# Patient Record
Sex: Female | Born: 1980 | Race: White | Hispanic: No | Marital: Married | Smoking: Former smoker
Health system: Southern US, Community
[De-identification: ages and names within clinical notes are randomized; demographics above are authoritative.]

## PROBLEM LIST (undated history)

## (undated) DIAGNOSIS — E119 Type 2 diabetes mellitus without complications: Secondary | ICD-10-CM

## (undated) DIAGNOSIS — I10 Essential (primary) hypertension: Secondary | ICD-10-CM

## (undated) DIAGNOSIS — F419 Anxiety disorder, unspecified: Secondary | ICD-10-CM

## (undated) DIAGNOSIS — N920 Excessive and frequent menstruation with regular cycle: Secondary | ICD-10-CM

## (undated) HISTORY — DX: Type 2 diabetes mellitus without complications: E11.9

## (undated) HISTORY — DX: Essential (primary) hypertension: I10

## (undated) HISTORY — DX: Excessive and frequent menstruation with regular cycle: N92.0

## (undated) HISTORY — PX: TUBAL LIGATION: SHX77

---

## 2000-11-27 ENCOUNTER — Other Ambulatory Visit: Admission: RE | Admit: 2000-11-27 | Discharge: 2000-11-27 | Payer: Self-pay | Admitting: Obstetrics and Gynecology

## 2001-05-03 ENCOUNTER — Ambulatory Visit (HOSPITAL_COMMUNITY): Admission: RE | Admit: 2001-05-03 | Discharge: 2001-05-03 | Payer: Self-pay | Admitting: Obstetrics and Gynecology

## 2001-05-13 ENCOUNTER — Ambulatory Visit (HOSPITAL_COMMUNITY): Admission: RE | Admit: 2001-05-13 | Discharge: 2001-05-13 | Payer: Self-pay | Admitting: Obstetrics and Gynecology

## 2001-05-17 ENCOUNTER — Observation Stay (HOSPITAL_COMMUNITY): Admission: AD | Admit: 2001-05-17 | Discharge: 2001-05-18 | Payer: Self-pay | Admitting: Obstetrics and Gynecology

## 2001-05-21 ENCOUNTER — Ambulatory Visit (HOSPITAL_COMMUNITY): Admission: RE | Admit: 2001-05-21 | Discharge: 2001-05-21 | Payer: Self-pay | Admitting: Obstetrics and Gynecology

## 2001-05-24 ENCOUNTER — Ambulatory Visit (HOSPITAL_COMMUNITY): Admission: RE | Admit: 2001-05-24 | Discharge: 2001-05-24 | Payer: Self-pay | Admitting: Obstetrics and Gynecology

## 2001-05-28 ENCOUNTER — Ambulatory Visit (HOSPITAL_COMMUNITY): Admission: AD | Admit: 2001-05-28 | Discharge: 2001-05-28 | Payer: Self-pay | Admitting: Obstetrics and Gynecology

## 2001-06-01 ENCOUNTER — Ambulatory Visit (HOSPITAL_COMMUNITY): Admission: RE | Admit: 2001-06-01 | Discharge: 2001-06-01 | Payer: Self-pay | Admitting: Obstetrics and Gynecology

## 2001-06-02 ENCOUNTER — Inpatient Hospital Stay (HOSPITAL_COMMUNITY): Admission: RE | Admit: 2001-06-02 | Discharge: 2001-06-06 | Payer: Self-pay | Admitting: Obstetrics and Gynecology

## 2005-08-04 ENCOUNTER — Encounter: Payer: Self-pay | Admitting: Obstetrics and Gynecology

## 2005-08-04 ENCOUNTER — Inpatient Hospital Stay (HOSPITAL_COMMUNITY): Admission: RE | Admit: 2005-08-04 | Discharge: 2005-08-07 | Payer: Self-pay | Admitting: Obstetrics and Gynecology

## 2008-03-02 ENCOUNTER — Other Ambulatory Visit: Admission: RE | Admit: 2008-03-02 | Discharge: 2008-03-02 | Payer: Self-pay | Admitting: Obstetrics and Gynecology

## 2010-11-08 NOTE — Op Note (Signed)
NAME:  Eileen Nunez, Eileen Nunez                ACCOUNT NO.:  [id number]   MEDICAL RECORD NO.:  [id number]          PATIENT TYPE:  INP   LOCATION:  A401                          FACILITY:  [facility]   PHYSICIAN:  [name], M.D. DATE OF BIRTH:  23-Nov-1980   DATE OF PROCEDURE:  08/04/2005  DATE OF DISCHARGE:                                 OPERATIVE REPORT   PREOPERATIVE DIAGNOSES:  1.  Pregnancy, 39 weeks.  2.  Repeat Cesarean section, not for trial for labor.  3.  Desire for elective permanent sterilization.   POSTOPERATIVE DIAGNOSES:  1.  Pregnancy, 39 weeks.  2.  Repeat Cesarean section, not for trial for labor.  3.  Desire for elective permanent sterilization.   PROCEDURE:  Repeat low transverse cervical Cesarean section, partial  salpingectomy, wide excision of cicatrix.   SURGEON:  [name], M.D.   ASSISTANT:  Forde Dandy, R.N.   ANESTHESIA:  Spinal.   COMPLICATIONS:  None.   FINDINGS:  Healthy, 8-pound 4.1-ounce, female infant. Lower abdominal laxity  allowing easy excision of cicatrix.   DETAILS OF PROCEDURE:  The patient was taken to the operating room, prepped  and draped in usual fashion for low abdominal surgery after spinal  anesthesia was introduced with Foley catheter in place. Pfannenstiel-type  incision was performed approximately 3 cm below the old scar, making a wider  incision than before, total length approximately 25 to 30 cm. The incision  was incised sharply down to the fascia which was opened transversely,  dissected from the underlying rectus muscles and the peritoneal cavity  entered in the midline. Bladder flap was easily developed on the very floppy  lower uterine segment. Transverse uterine incision was made in the lower  uterine segment where it was quite thin, fetal vertex attempted to be  delivered. Because of the flexibility of the uterine contour, it required  use of the vacuum extractor to ease the deliver process, and an 8-pound 4.1-  ounce infant, Apgars 8 and 9 was delivered without difficulty. See Dr.  Samul Dada notes for further details.   Cord blood samples were obtained. Placenta delivered intact, [facility]  presentation. The uterus was closed in a single layer of running locking  closure of 0 chromic with good hemostasis.   The bladder flap was then reapproximated.   Tubal ligation. Tubal sterilization was performed by incarcerating a mid  segment knuckle of each fallopian tube with 2-0 chromic followed by excision  of the incarcerated knuckle of tube and confirmation of hemostasis.   Abdomen was irrigated with antibiotic-containing solution as the uterus had  been. The anterior peritoneum was closed with 2-0 chromic. There was some  omentum adhesions to the anterior peritoneal surface from the prior  incision, and the majority of those were cut free to improve omental  mobility.   Once peritoneum was closed with running 2-0 chromic, the fascia was trimmed  and the overlying fatty tissue, excising an 8-cm wide x 20 to 25-cm long  ellipse of skin and fatty tissue. The fascia was shortened by a distance of  approximately 2 cm at  its mid point and tapering to the sides. The fascia  was pulled together in the midline with good results. Subcutaneous tissues  required 2-0 chromic interrupted reapproximation, and then staple closure of  the skin completed the procedure. A subcutaneous JP drain was left in place  in the subcu fatty space. Sponge and needle counts were correct. The patient  tolerated the procedure well and went to recovery room in good condition.      [name], M.D.  Electronically Signed     JVF/MEDQ  D:  08/04/2005  T:  08/05/2005  Job:  [id number]

## 2010-11-08 NOTE — Op Note (Signed)
[facility]  Patient:    Eileen Nunez, Eileen Nunez Visit Number: [id number] MRN: [id number]          Service Type: OBS Location: 4A A427 01 Attending Physician:  [name] Dictated by:   Christin Bach, M.D. Proc. Date: 06/03/01 Admit Date:  06/02/2001                             Operative Report  PREOPERATIVE DIAGNOSES: 1. Pregnancy at 38 weeks. 2. Pregnancy-induced hypertension. 3. Failed medical induction of labor. 4. Cephalopelvic disproportion.  POSTOPERATIVE DIAGNOSES: 1. Pregnancy at 38 weeks. 2. Pregnancy-induced hypertension. 3. Failed medical induction of labor. 4. Cephalopelvic disproportion. 5. Secondary to persistent occiput posterior presentation with arrested    dilation at pelvic inlet.  OPERATION:  Primary low transverse cervical cesarean section  SURGEON:  Christin Bach, M.D.  ASSISTANTKarna Dupes C.S.T.  ANESTHESIA:  Spinal, ______  COMPLICATIONS:  None.  FINDINGS:  Health female infant.  Apgars 8/9.  Weight ______ and occiput posterior presentation at pelvic inlet with no entry into the birth canal occurring.  DETAILS OF PROCEDURE:  The patient was taken to the operating room and prepped and draped for lower abdominal surgery after spinal anesthesia was introduced. Pfannenstiel type incision performed approximately 20 cm in length through the skin and subcutaneous tissue with transverse opening of the fascia and midline opening of the rectus muscles and easy entry into the peritoneal cavity.   The bladder flap was developed on the well developed lower uterine segment  which was then nicked transversely and extended laterally using index finger traction.  The vertex was rotated from its right occiput posterior presentation into the incision.  Vacuum extractor applied to guide the vertex, and the infant delivered through the incision with good results.  The baby cried vigorously.  There was nuchal cord x 1.  Release of the  shoulders was performed, and then the baby delivered easily.  Amniotic fluid was clear ______ .  Cord was clamped and infant passed to awaiting physician, Dr. Lubertha South.  Cord blood samples were obtained.  Placenta delivered [facility] presentation. The membrane remnants removed using sponges, and the uterus irrigated with antibiotic solution.  Pennington clamps were used to grasp the uterine edges, particularly from two large varicose veins in the lower uterine segment transverse incision edge which were bleeding heavily.  Single layer running locking closure of the uterine incision resulted in good tissue closure and hemostasis, and 2-0 chromic closure of the bladder flap completed the bladder flap closure.  The peritoneal cavity was irrigated briefly.  Laparotomy equipment removed, 2-0 chromic used on peritoneal cavity, rectus muscles pulled in the midline, two interrupted sutures of 2-0 chromic and then 0 Vicryl closure of the fascia performed.  Subcutaneous fatty tissues were irrigated, confirmed as hemostatic, closed using interrupted 2-0 plain x 3 stitch and staple closure of skin completed the procedure.  Estimated blood loss was 2 cc. Dictated by:   Christin Bach, M.D. Attending Physician:  [name] DD:  06/03/01 TD:  06/03/01 Job: [id number]

## 2010-11-08 NOTE — H&P (Signed)
NAME:  Eileen Nunez, ALMANZAR                ACCOUNT NO.:  [id number]   MEDICAL RECORD NO.:  [id number]          PATIENT TYPE:  AMB   LOCATION:  DAY                           FACILITY:  [facility]   PHYSICIAN:  [name], M.D. DATE OF BIRTH:  1980-08-08   DATE OF ADMISSION:  08/04/2005  DATE OF DISCHARGE:  LH                                HISTORY & PHYSICAL   HISTORY OF PRESENT ILLNESS:  This 30 year old female gravida 2, para 1, AB0,  LMP Nov 08, 2004 placing her menstrual [facility] August 15, 2005 with  corresponding first and second trimester ultrasounds.  She is admitted for  repeat cesarean section.  Nicki has been followed through our office.  Has  decided to proceed with permanent sterilization at the time of cesarean  section.  Options of delayed sterilization at six months has been  specifically offered to patient.  She declines option of same.  Failure rate  of 1-2% for cesarean tubal ligations is discussed with patient.   PAST MEDICAL HISTORY:  1.  Gestational diabetes, diet controlled.  2.  Repeat cesarean section for wide excision of cicatrix.   PAST SURGICAL HISTORY:  Negative.   ALLERGIES:  None.   PHYSICAL EXAMINATION:  VITAL SIGNS:  Height 5 feet 8 inches, weight 249,  blood pressure 124/74, pulse 90s.  GENERAL:  Healthy-appearing Caucasian female, alert and oriented x3.  HEENT:  Pupils are equal, round, and reactive to light.  CARDIOVASCULAR:  Unremarkable.  ABDOMEN:  39 cm fundal height.  Estimated fetal weight 8 pounds.  PELVIC:  Cervix deferred at this time.   PRENATAL LABORATORIES:  O positive.  Urine drug screen negative.  Rubella  immunity present.  Hemoglobin 14, hematocrit 44.  Hepatitis, HIV, RPR, GC  and Chlamydia all negative.  Glucose tolerance test abnormal with 3-hour  glucose tolerance test failed.   PLAN:  Repeat cesarean section, tubal ligation with wide excision of  cicatrix on August 04, 2005.      [name], M.D.  Electronically  Signed     JVF/MEDQ  D:  07/25/2005  T:  07/25/2005  Job:  [id number]   cc:   Francoise Schaumann. Raynelle Highland  Fax: [phone]   [facility]  Fax: [phone]   [facility]

## 2010-11-08 NOTE — Discharge Summary (Signed)
NAMECESILY, CUOCO                ACCOUNT NO.:  [id number]   MEDICAL RECORD NO.:  [id number]          PATIENT TYPE:  INP   LOCATION:  A401                          FACILITY:  [facility]   PHYSICIAN:  [name], M.D. DATE OF BIRTH:  March 05, 1981   DATE OF ADMISSION:  08/04/2005  DATE OF DISCHARGE:  02/14/2007LH                                 DISCHARGE SUMMARY   ADMISSION DIAGNOSES:  1.  Pregnancy at 38-1/2 weeks for repeat cesarean section.  2.  Desire for elective sterilization.   DISCHARGE DIAGNOSES:  1.  Pregnancy at 38-1/2 weeks, delivered.  2.  Repeat cesarean section.  3.  Elective sterilization.  4.  Wide excision of cicatrix.   HOSPITAL SUMMARY:  This 30 year old female, gravida 2, para 1, AB 0 was  admitted for repeat cesarean section, tubal ligation and wide excision of  cicatrix as described in HPI.   HOSPITAL COURSE:  The patient was admitted with hemoglobin 10, hematocrit  30, blood type O positive.  She underwent repeat cesarean section, tubal  ligation with wide excision of cicatrix with 600 cc of blood loss.  There  were no complications of the surgery.  JP drain was left in place.  Postoperative hematocrit was 25%.  The patient breast fed and bottles  supplementation, at the time during hospitalization.  JP drain was removed  on postop day #2.   DISCHARGE MEDICATIONS:  1.  Tylox one q.6 h p.r.n. pain.  2.  Ibuprofen 200 mg two q.4 h p.r.n. cramps.  3.  Chromatin Forte iron supplementation b.i.d. for 30 days.   FOLLOWUP:  Follow up in five days for staple removal.      [name], M.D.  Electronically Signed     JVF/MEDQ  D:  08/06/2005  T:  08/06/2005  Job:  [id number]

## 2010-11-08 NOTE — H&P (Signed)
NAME:  Eileen Nunez, Eileen Nunez                ACCOUNT NO.:  [id number]   MEDICAL RECORD NO.:  [id number]          PATIENT TYPE:  AMB   LOCATION:  DAY                           FACILITY:  [facility]   PHYSICIAN:  [name], M.D. DATE OF BIRTH:  [birth year]   DATE OF ADMISSION:  08/04/2005  DATE OF DISCHARGE:  LH                                HISTORY & PHYSICAL   ADMISSION DIAGNOSES:  1.  Pregnancy at 38-1/2 weeks' gestation.  2.  Repeat cesarean section.  3.  Desire for elective permanent sterilization.   HISTORY OF PRESENT ILLNESS:  This 30 year old female, G2, P101, AB 0, LMP on  Nov 08, 2004, placing menstrual [facility] August 15, 2005, with corresponding  first- and second-trimester ultrasounds is admitted after pregnancy follow  up in our office and notable for gestational diabetes, diet-controlled with  excellent blood sugars.  The patient has been followed through our office  with 15 prenatal visits with appropriate weight gain and fundal height  growth.  She has decided and wants to proceed with permanent sterilization  with tubal ligation discussed with failure rate of 1% to 2% discussed.  The  patient understands this and desires to proceed at this time even though  interval options of delayed sterilization are emphasized at length including  risks such as crib death, etc.   PAST SURGICAL HISTORY:  Benign.   ALLERGIES:  No known drug allergies.   SOCIAL HISTORY:  No cigarettes at present.  Previously a smoker.  Alcohol  and recreational drugs were denied.   PHYSICAL EXAMINATION:  VITAL SIGNS:  Height 5 feet 8 inches.  Estimated  weight 249 which is a 36 pound weight gain.  Blood pressure 124/74.  GENERAL:  A healthy, cheerful, overweight, Caucasian female.  HEENT:  Pupils equal round and reactive.  Extraocular movements intact.  CARDIAC:  Unremarkable.  ABDOMEN:  A 39 cm fundal height.  Lax lower abdominal skin secondary to  pregnancy.  Well-healed Pfannenstiel incision.   PRENATAL  LABORATORY DATA:  Blood type O positive.  Urine drug screen  negative.  Rubella immune.  Hemoglobin 14, hematocrit 44.  Hepatitis, HIV,  GC, chlamydia, RPR, HSV-1 and HSV-2 all negative.  Glucose  tolerance test abnormal.  Subsequent blood sugars generally excellent except  for occasional blood sugars high in the 170s when the patient eats  abnormally.   PLAN:  Repeat cesarean section and tubal ligation with wide-excision of  cicatrix on August 04, 2005.      [name], M.D.  Electronically Signed     JVF/MEDQ  D:  07/25/2005  T:  07/25/2005  Job:  [id number]

## 2010-11-08 NOTE — Discharge Summary (Signed)
[facility]  Patient:    Eileen Nunez, Eileen Nunez Visit Number: [id number] MRN: [id number]          Service Type: OBS Location: 4A A427 01 Attending Physician:  [name] Dictated by:   Christin Bach, M.D. Admit Date:  06/02/2001   CC:         Lubertha South, M.D.   Discharge Summary  ADMITTING DIAGNOSES:  Pregnancy 37 weeks 5 days, pregnancy induced hypertension, indicated induction of labor.  DISCHARGE DIAGNOSES:  Pregnancy at 37 weeks 5 days, delivered, pregnancy induced hypertension, stable, failed medical induction secondary to cephalopelvic disproportion.  PROCEDURE:  Cytotec cervical ripening x 6 doses, Pitocin induction of labor, unsuccessful, primary low transverse cervical cesarean section.  Cesarean section was performed on June 03, 2001.  DISCHARGE MEDICATIONS: 1. Labetalol 100 mg p.o. b.i.d. x 30 days. 2. Prenatal iron and vitamins daily. 3. Tylox one q.4h. p.r.n. pain x 20 tablets.  FOLLOWUP:  Four weeks in our office for postpartum visit with the patient breast-feeding and birth control undecided.  HISTORY OF PRESENT ILLNESS:  This 30 year old female gravida 1, para 0 is admitted for induction of labor after progressive increase in blood pressure and edema.  The prenatal course had been complicated by marked weight gain beginning the pregnancy at 189 progressing to 259, 70 pound total weight gain. The patient has had mild hypertension treated with labetalol 200 mg b.i.d. Deep tendon reflexes remained acceptable at 2+.  There has been 3+ pitting edema to the knees with equal edema in the face and upper extremities.  There has been no right upper quadrant or liver function test abnormality.  However, the progression in her symptoms have warranted proceeding with induction. Cervix is soft, anterior, and favorable, the presenting part quite high.  HOSPITAL COURSE:  Patient was admitted.  Received Cytotec cervical  ripening throughout the day of admission June 02, 2001 and throughout the night of December 11.  On June 03, 2001 Pitocin induction of labor and amniotomy was begun at 8 a.m.  The patient never progressed past 2 cm even though the cervix was quite soft and was definitely not the restricting factor. Presenting part remained quite high out of the pelvis and when we did the cesarean section on the afternoon of June 03, 2001 the vertex was found to be in right occiput posterior presentation at the pelvic inlet.  It was considered to be an inlet dystocia.  Infant was a healthy female infant.  Apgars 8/9.  Weight 8 pounds 11.1 ounces (3945 g) with Apgars 9/9 assigned by Dr. Lubertha South, pediatrician in attendance.  Estimated blood loss at delivery was 800 cc.  Patients postpartum course was straightforward.  The patients admitting hemoglobin had been 10.4, hematocrit 29.7 with postpartum hemoglobin 9.1 on postoperative day #1 and hemoglobin 8.1 with hematocrit 22.2 on postoperative day #3.  The patient is mobilizing generous amounts of fluid.  CONDITION ON DISCHARGE:  June 06, 2001:  Stable on postoperative day #3. The patient will be converted to hotel status as the baby has mild neonatal jaundice and will need to stay an additional day due to bilirubin 17.  FOLLOWUP:  Four weeks at our office.  Diuretic was not administered as the patient is breast-feeding. Dictated by:   Christin Bach, M.D. Attending Physician:  [name] DD:  06/06/01 TD:  06/06/01 Job: [id number]

## 2010-11-08 NOTE — Op Note (Signed)
NAMEJORDY, Eileen Nunez                ACCOUNT NO.:  [id number]   MEDICAL RECORD NO.:  [id number]          PATIENT TYPE:  INP   LOCATION:  A401                          FACILITY:  [facility]   PHYSICIAN:  [name], M.D. DATE OF BIRTH:  [birth year]   DATE OF PROCEDURE:  08/04/2005  DATE OF DISCHARGE:  08/07/2005                                 OPERATIVE REPORT   PREOPERATIVE DIAGNOSIS:  Pregnancy, 38-1/[redacted] weeks gestation. Repeat Cesarean  section. Not for trial of labor. Elective permanent sterilization.   POSTOPERATIVE DIAGNOSIS:  Pregnancy, 38-1/[redacted] weeks gestation. Repeat Cesarean  section. Not for trial of labor. Elective permanent sterilization.   PROCEDURE:  Repeat Cesarean section. Bilateral tubal ligation. Wide excision  of old scar.   SURGEON:  Dr. Emelda Fear.   ASSISTANT:  None.   ANESTHESIA:  Spinal.   COMPLICATIONS:  None.   FINDINGS:  Healthy female infant, Apgars 9 and 9.   DETAILS OF PROCEDURE:  The patient was taken to the operating room. Spinal  anesthesia introduced. Abdomen prepped and draped and lower abdominal  incision repeated. Due to the patient's request for excision of the old  cicatrix due to lower abdominal laxity, we excised a wide area of the  cicatrix. This was approximately 10 cm in width and 30 cm in transverse  length. This went all the way down to the fascia. Fascia was then opened in  a standard Pfannenstiel technique transversely. Rectus muscles split in the  midline and the peritoneal cavity opened without difficulty. The lower  abdomen was entered without difficulty. Bladder flap developed on the thin  lower uterine segment and transverse uterine incision made using a knife for  incision and the index fingers for transverse traction to open it  sufficiently to guide the vertex through the incision. Cord was clamped.  Infant passed to waiting pediatrician, Dr. Milinda Cave. See his notes for  details. Cord blood gases were obtained, and placenta  delivered [facility]  presentation. Placenta bed and uterus irrigated with antibiotic solution. A  single layer of running locking closure of the uterine incision followed.  Bladder flap was then reapproximated with 2-0 chromic. The tubal ligation  was negative.   Tubal ligation. Tubal ligation was performed by grasping the mid segment  knuckle of each tube, elevating it and doubly ligating with 0 chromic around  the incarcerated knuckle of tube. The incarcerated specimen was excised. The  opposite side was treated similarly.   Anterior peritoneum was inspected. Abdomen confirmed as hemostatic,  irrigated, anterior peritoneum closed with 2-0 chromic. The fascia trimmed  of approximately 1 inch in the midline to allow for more satisfactory  cosmetic skin edge approximation, then the fascia pulled together with  running 0 Vicryl, then the subcutaneous tissues reapproximated with  interrupted 2-0 plain. The subcu JP drain was placed in the subcu fatty  space. Antibiotic irrigation was performed again. Staple closure of the skin  followed, and the patient went to the recovery room in good condition with  clear fluid exuding from the JP drain. The patient went to the recovery room  in good condition.  Sponge and needle counts correct. EBL 600 cc.      [name], M.D.  Electronically Signed     JVF/MEDQ  D:  08/11/2005  T:  08/12/2005  Job:  [id number]   cc:   Francoise Schaumann. Milford Cage DO, FAAP  Fax: [phone]

## 2010-11-08 NOTE — H&P (Signed)
[facility]  Patient:    Eileen Nunez, Eileen Nunez Visit Number: [id number] MRN: [id number]          Service Type: OBS Location: 4A A427 01 Attending Physician:  [name] Dictated by:   Zerita Boers, N.M. Admit Date:  06/02/2001   CC:         [facility]   History and Physical  DATE OF BIRTH:  1981/02/23  ADMISSION DIAGNOSIS:  Pregnancy at 37 weeks, 5 days, with pregnancy-induced hypertension, pitting edema.  Being admitted for induction of labor of pregnancy-induced hypertension.  PAST MEDICAL HISTORY:  Negative.  PAST SURGICAL HISTORY:  Negative.  ALLERGIES:  No known drug allergies.  MEDICATIONS:  Prenatal vitamins and labetalol 200 b.i.d.  SOCIAL HISTORY:  She is married.  Her husband is supportive.  FAMILY HISTORY:  Positive for Crohns.  PRENATAL COURSE:  Prenatal course has been complicated by pregnancy-induced hypertension starting at 34 weeks.  The patient at that time was placed on labetalol 100 b.i.d.  That managed the patient for several weeks, and then she had another increase.  Her labetalol has been increased to 200 b.i.d., and now we are not controlling her hypertension with 200 labetalol b.i.d.  Blood pressure for the past two days has been 140/90.  DTRs are 2+.  There is 3+ pitting edema.  The patient has had a 70-pound weight gain during the pregnancy.  DTRs are 2+ bilaterally, no clonus.  PLAN:  We are going to admit for Cytotec induction of labor for pregnancy-induced hypertension. Dictated by:   Zerita Boers, N.M. Attending Physician:  [name] DD:  06/02/01 TD:  06/02/01 Job: [id number]

## 2011-06-30 ENCOUNTER — Other Ambulatory Visit (HOSPITAL_COMMUNITY)
Admission: RE | Admit: 2011-06-30 | Discharge: 2011-06-30 | Disposition: A | Discharge status: Home / Self Care | Payer: BC Managed Care – PPO | Source: Ambulatory Visit | Attending: Obstetrics and Gynecology | Admitting: Obstetrics and Gynecology

## 2011-06-30 DIAGNOSIS — Z01419 Encounter for gynecological examination (general) (routine) without abnormal findings: Secondary | ICD-10-CM | POA: Insufficient documentation

## 2012-07-29 ENCOUNTER — Other Ambulatory Visit: Payer: Self-pay | Admitting: Adult Health

## 2012-07-29 ENCOUNTER — Other Ambulatory Visit (HOSPITAL_COMMUNITY)
Admission: RE | Admit: 2012-07-29 | Discharge: 2012-07-29 | Disposition: A | Discharge status: Home / Self Care | Payer: BC Managed Care – PPO | Source: Ambulatory Visit | Attending: Obstetrics and Gynecology | Admitting: Obstetrics and Gynecology

## 2012-07-29 DIAGNOSIS — Z1151 Encounter for screening for human papillomavirus (HPV): Secondary | ICD-10-CM | POA: Insufficient documentation

## 2012-07-29 DIAGNOSIS — Z01419 Encounter for gynecological examination (general) (routine) without abnormal findings: Secondary | ICD-10-CM | POA: Insufficient documentation

## 2012-10-08 ENCOUNTER — Encounter: Payer: Self-pay | Admitting: *Deleted

## 2012-10-12 ENCOUNTER — Encounter: Payer: Self-pay | Admitting: Nurse Practitioner

## 2012-10-12 ENCOUNTER — Ambulatory Visit (INDEPENDENT_AMBULATORY_CARE_PROVIDER_SITE_OTHER): Payer: BC Managed Care – PPO | Admitting: Nurse Practitioner

## 2012-10-12 VITALS — BP 118/94 | Wt 207.0 lb

## 2012-10-12 DIAGNOSIS — R5381 Other malaise: Secondary | ICD-10-CM

## 2012-10-12 DIAGNOSIS — Z13220 Encounter for screening for lipoid disorders: Secondary | ICD-10-CM

## 2012-10-12 DIAGNOSIS — I10 Essential (primary) hypertension: Secondary | ICD-10-CM

## 2012-10-12 DIAGNOSIS — F418 Other specified anxiety disorders: Secondary | ICD-10-CM

## 2012-10-12 DIAGNOSIS — R5383 Other fatigue: Secondary | ICD-10-CM

## 2012-10-12 DIAGNOSIS — F341 Dysthymic disorder: Secondary | ICD-10-CM

## 2012-10-12 MED ORDER — PHENTERMINE-TOPIRAMATE ER 3.75-23 MG PO CP24: 3.7500 mg | ORAL_CAPSULE | Freq: Every day | ORAL | Status: DC

## 2012-10-12 MED ORDER — BUPROPION HCL ER (XL) 150 MG PO TB24: ORAL_TABLET | ORAL | Status: DC

## 2012-10-12 MED ORDER — PHENTERMINE-TOPIRAMATE ER 7.5-46 MG PO CP24: 7.5000 mg | ORAL_CAPSULE | Freq: Every day | ORAL | Status: DC

## 2012-10-13 ENCOUNTER — Encounter: Payer: Self-pay | Admitting: Nurse Practitioner

## 2012-10-13 DIAGNOSIS — I10 Essential (primary) hypertension: Secondary | ICD-10-CM | POA: Insufficient documentation

## 2012-10-13 DIAGNOSIS — F418 Other specified anxiety disorders: Secondary | ICD-10-CM | POA: Insufficient documentation

## 2012-10-13 NOTE — Assessment & Plan Note (Signed)
Increase Wellbutrin XL to 300 mg

## 2012-10-13 NOTE — Assessment & Plan Note (Signed)
Start Qsymia as directed.  Continue activity.  Healthy diet.  Recheck in 6 weeks.

## 2012-10-13 NOTE — Progress Notes (Signed)
Subjective:  Presents for followup. Continues to have increased stress in her life, was told recently that she is currently laid off from her job. Overall doing well with her anxiety and stress level, does continue to have some decreased energy. Interested in increasing her Wellbutrin dosage. Also has slowly put on some of her weight. Would like to try a different weight loss medicine. Has taken phentermine fine in the past. No chest pain or shortness of breath. No edema. Compliant with her blood pressure medication. Gets regular preventive health physicals. Has not had her routine lab work.  Objective:   BP 118/94  Wt 207 lb (93.895 kg)  LMP 10/09/2012 NAD. Alert, oriented. Lungs clear. Heart regular rate rhythm. Lower extremities no edema.  Assessment:Hypertension - Plan: Basic metabolic panel, Basic metabolic panel  Fatigue - Plan: Basic metabolic panel, Hepatic function panel, TSH, Vitamin D 25 hydroxy, Basic metabolic panel, Hepatic function panel, TSH, Vitamin D 25 hydroxy  Lipid screening - Plan: Lipid panel, Lipid panel  Morbid obesity  Depression with anxiety  Plan: Meds ordered this encounter  Medications  . buPROPion (WELLBUTRIN XL) 150 MG 24 hr tablet    Sig: Take one each day x 4 days then two each day    Dispense:  56 tablet    Refill:  0    Order Specific Question:  Supervising Provider    Answer:  Merlyn Albert [[id number]]  . Phentermine-Topiramate (QSYMIA) 7.5-46 MG CP24    Sig: Take 7.5-46 mg by mouth daily.    Dispense:  30 capsule    Refill:  0    Order Specific Question:  Supervising Provider    Answer:  Merlyn Albert [[id number]]  . Phentermine-Topiramate (QSYMIA) 3.75-23 MG CP24    Sig: Take 3.75-23 mg by mouth daily.    Dispense:  14 capsule    Refill:  0    Order Specific Question:  Supervising Provider    Answer:  Merlyn Albert [[id number]]   Wellbutrin XL 150 mg 1 by mouth daily x4 days then increase to 2 by mouth daily. If any problems, go back to once a  day dosing. Encourage regular activity and healthy diet. Patient has had a tubal ligation. Discussed risk of birth defects with use of Topamax t. Recheck in 6 weeks, call back sooner if any problems.

## 2012-10-15 LAB — LIPID PANEL
Cholesterol: 134 mg/dL (ref 0–200)
Total CHOL/HDL Ratio: 3.1 Ratio
Triglycerides: 111 mg/dL (ref <150)
VLDL: 22 mg/dL (ref 0–40)

## 2012-10-15 LAB — HEPATIC FUNCTION PANEL
ALT: 12 U/L (ref 0–35)
Bilirubin, Direct: 0.2 mg/dL (ref 0.0–0.3)
Total Bilirubin: 0.9 mg/dL (ref 0.3–1.2)

## 2012-10-15 LAB — BASIC METABOLIC PANEL
BUN: 14 mg/dL (ref 6–23)
Creat: 1.13 mg/dL — ABNORMAL HIGH (ref 0.50–1.10)

## 2012-10-19 ENCOUNTER — Encounter: Payer: Self-pay | Admitting: Nurse Practitioner

## 2012-11-13 ENCOUNTER — Other Ambulatory Visit: Payer: Self-pay | Admitting: Nurse Practitioner

## 2012-11-16 ENCOUNTER — Other Ambulatory Visit: Payer: Self-pay | Admitting: *Deleted

## 2012-11-16 MED ORDER — BUPROPION HCL ER (XL) 150 MG PO TB24: ORAL_TABLET | ORAL | Status: DC

## 2012-11-23 ENCOUNTER — Ambulatory Visit: Payer: BC Managed Care – PPO | Admitting: Nurse Practitioner

## 2012-11-24 ENCOUNTER — Encounter: Payer: Self-pay | Admitting: Nurse Practitioner

## 2012-11-24 ENCOUNTER — Ambulatory Visit (INDEPENDENT_AMBULATORY_CARE_PROVIDER_SITE_OTHER): Payer: BC Managed Care – PPO | Admitting: Nurse Practitioner

## 2012-11-24 VITALS — BP 110/80 | HR 80 | Ht 70.0 in | Wt 202.4 lb

## 2012-11-24 DIAGNOSIS — I10 Essential (primary) hypertension: Secondary | ICD-10-CM

## 2012-11-24 DIAGNOSIS — F341 Dysthymic disorder: Secondary | ICD-10-CM

## 2012-11-24 DIAGNOSIS — F418 Other specified anxiety disorders: Secondary | ICD-10-CM

## 2012-11-24 MED ORDER — PHENTERMINE-TOPIRAMATE ER 11.25-69 MG PO CP24: 11.2500 mg | ORAL_CAPSULE | Freq: Every day | ORAL | Status: DC

## 2012-11-24 NOTE — Assessment & Plan Note (Signed)
Patient ran out of Wellbutrin 2 weeks ago. Is doing fine. Has made some lifestyle changes including increased activity. DC Wellbutrin.

## 2012-11-24 NOTE — Progress Notes (Signed)
Subjective:  Presents for recheck. Is now exercising 4-5 days per week, has started a walking program with her friend. Went off her Wellbutrin about 2 weeks ago. Has felt fine since then. Feels much better now that she can get outside for exercise. Doing well on Qsymia. Denies any adverse affects. Continues to have several family issues although she is dealing with this much better.  Objective:   BP 110/80  Pulse 80  Ht 5\' 10"  (1.778 m)  Wt 202 lb 6 oz (91.797 kg)  BMI 29.04 kg/m2 NAD. Alert, oriented. Lungs clear. Heart regular rate rhythm. BP 110/80 per nurse.  Assessment:Hypertension  Morbid obesity  Plan: Meds ordered this encounter  Medications  . Phentermine-Topiramate (QSYMIA) 11.25-69 MG CP24    Sig: Take 11.25-69 mg by mouth daily.    Dispense:  30 capsule    Refill:  2    Order Specific Question:  Supervising Provider    Answer:  Merlyn Albert [[id number]]   stop Wellbutrin. Patient will run out of insurance near the end of July. To take Qsymia until then. Continue to work on [employer] and continue exercise. Recheck as needed.

## 2012-11-24 NOTE — Assessment & Plan Note (Signed)
Increase Qsymia to 11.25/69 daily.

## 2012-11-24 NOTE — Assessment & Plan Note (Signed)
Stable. BP 110/80.

## 2013-01-20 ENCOUNTER — Other Ambulatory Visit: Payer: Self-pay | Admitting: *Deleted

## 2013-06-27 ENCOUNTER — Other Ambulatory Visit: Payer: Self-pay | Admitting: Nurse Practitioner

## 2013-06-29 ENCOUNTER — Other Ambulatory Visit: Payer: Self-pay | Admitting: Nurse Practitioner

## 2013-07-11 ENCOUNTER — Ambulatory Visit (INDEPENDENT_AMBULATORY_CARE_PROVIDER_SITE_OTHER): Payer: BC Managed Care – PPO | Admitting: Nurse Practitioner

## 2013-07-11 ENCOUNTER — Encounter: Payer: Self-pay | Admitting: Nurse Practitioner

## 2013-07-11 VITALS — BP 122/76 | Ht 70.0 in | Wt 201.0 lb

## 2013-07-11 DIAGNOSIS — F418 Other specified anxiety disorders: Secondary | ICD-10-CM

## 2013-07-11 DIAGNOSIS — F341 Dysthymic disorder: Secondary | ICD-10-CM

## 2013-07-11 MED ORDER — BUPROPION HCL ER (XL) 300 MG PO TB24: 300.0000 mg | ORAL_TABLET | Freq: Every day | ORAL | Status: DC

## 2013-07-12 ENCOUNTER — Encounter: Payer: Self-pay | Admitting: Nurse Practitioner

## 2013-07-12 NOTE — Assessment & Plan Note (Signed)
Stop Qsymia. Restart Wellbutrin as directed. Recheck in 6 months, sooner if needed.

## 2013-07-12 NOTE — Progress Notes (Signed)
Subjective:  Presents for follow up. Has been under more stress for the past few months. Lost her father and nephew in June. Has noticed more depression, fatigue and lack of interest lately. Would like to stop Qsymia and restart Wellbutrin which she has taken fine in the past. Good family support. Has restarted working but does not care for her current job. Doing well with diet. Plans to restart exercise program.  Objective:   BP 122/76  Ht 5\' 10"  (1.778 m)  Wt 201 lb (91.173 kg)  BMI 28.84 kg/m2  NAD. Alert, oriented. Lungs clear. Heart RRR.  Assessment: Depression with anxiety  Plan:  Meds ordered this encounter  Medications  . buPROPion (WELLBUTRIN XL) 300 MG 24 hr tablet    Sig: Take 1 tablet (300 mg total) by mouth daily.    Dispense:  30 tablet    Refill:  5    Order Specific Question:  Supervising Provider    Answer:  Merlyn AlbertLUKING, WILLIAM S [[id number]]   Stop Qsymia. Recheck in 6 months, sooner if any problems.

## 2013-07-12 NOTE — Assessment & Plan Note (Signed)
Stop Qsymia. Encouraged patient to restart exercise program.

## 2013-08-01 ENCOUNTER — Other Ambulatory Visit: Payer: BC Managed Care – PPO | Admitting: Adult Health

## 2013-08-09 ENCOUNTER — Other Ambulatory Visit: Payer: BC Managed Care – PPO | Admitting: Adult Health

## 2013-08-22 ENCOUNTER — Other Ambulatory Visit: Payer: BC Managed Care – PPO | Admitting: Adult Health

## 2013-09-07 ENCOUNTER — Ambulatory Visit (INDEPENDENT_AMBULATORY_CARE_PROVIDER_SITE_OTHER): Payer: BC Managed Care – PPO | Admitting: Adult Health

## 2013-09-07 ENCOUNTER — Encounter (INDEPENDENT_AMBULATORY_CARE_PROVIDER_SITE_OTHER): Payer: Self-pay

## 2013-09-07 ENCOUNTER — Encounter: Payer: Self-pay | Admitting: Adult Health

## 2013-09-07 VITALS — BP 140/88 | HR 74 | Ht 69.0 in | Wt 201.0 lb

## 2013-09-07 DIAGNOSIS — N920 Excessive and frequent menstruation with regular cycle: Secondary | ICD-10-CM

## 2013-09-07 DIAGNOSIS — Z01419 Encounter for gynecological examination (general) (routine) without abnormal findings: Secondary | ICD-10-CM

## 2013-09-07 HISTORY — DX: Excessive and frequent menstruation with regular cycle: N92.0

## 2013-09-07 NOTE — Progress Notes (Signed)
Patient ID: Eileen Nunez, female   DOB: [birth year], 33 y.o.   MRN: [id number] History of Present Illness: Eileen Nunez is a 33 year old white female, married in for a physical, she had a normal pap with negative HPV 07/29/12.She is complaining of periods coming about every 3 weeks and has cramps and it is heavier now.She has had a tubal ligation and thinks she wants the ablation now.   Current Medications, Allergies, Past Medical History, Past Surgical History, Family History and Social History were reviewed in [facility] electronic medical record.     Review of Systems: Patient denies any headaches, blurred vision, shortness of breath, chest pain, abdominal pain, problems with bowel movements, urination, or intercourse. No joint pain, moods good on meds,see positives in HPI.Not ready to quit smoking.Aware of BMI.    Physical Exam:BP 140/88  Pulse 74  Ht 5\' 9"  (1.753 m)  Wt 201 lb (91.173 kg)  BMI 29.67 kg/m2  LMP 08/21/2013 General:  Well developed, well nourished, no acute distress Skin:  Warm and dry Neck:  Midline trachea, normal thyroid Lungs; Clear to auscultation bilaterally Breast:  No dominant palpable mass, retraction, or nipple discharge Cardiovascular: Regular rate and rhythm Abdomen:  Soft, non tender, no hepatosplenomegaly Pelvic:  External genitalia is normal in appearance.  The vagina is normal in appearance. The cervix is bulbous.  Uterus is felt to be normal size, shape, and contour.  No  adnexal masses or tenderness noted. Extremities:  No swelling or varicosities noted Psych:  No mood changes, alert and cooperative, seems happy Discussed labs from 2014.   Impression: Yearly gyn exam no pap Menorrhagia     Plan: Return in 1 week to discuss ablation with Dr Emelda FearFerguson  Review handout on ablation Physical in 1 year

## 2013-09-07 NOTE — Patient Instructions (Signed)
Endometrial Ablation Endometrial ablation removes the lining of the uterus (endometrium). It is usually a same-day, outpatient treatment. Ablation helps avoid major surgery, such as surgery to remove the cervix and uterus (hysterectomy). After endometrial ablation, you will have little or no menstrual bleeding and may not be able to have children. However, if you are premenopausal, you will need to use a reliable method of birth control following the procedure because of the small chance that pregnancy can occur. There are different reasons to have this procedure, which include:  Heavy periods.  Bleeding that is causing anemia.  Irregular bleeding.  Bleeding fibroids on the lining inside the uterus if they are smaller than 3 centimeters. This procedure should not be done if:  You want children in the future.  You have severe cramps with your menstrual period.  You have precancerous or cancerous cells in your uterus.  You were recently pregnant.  You have gone through menopause.  You have had major surgery on the uterus, such as a cesarean delivery. LET [facility] PROVIDER KNOW ABOUT:  Any allergies you have.  All medicines you are taking, including vitamins, herbs, eye drops, creams, and over-the-counter medicines.  Previous problems you or members of your family have had with the use of anesthetics.  Any blood disorders you have.  Previous surgeries you have had.  Medical conditions you have. RISKS AND COMPLICATIONS  Generally, this is a safe procedure. However, as with any procedure, complications can occur. Possible complications include:  Perforation of the uterus.  Bleeding.  Infection of the uterus, bladder, or vagina.  Injury to surrounding organs.  An air bubble to the lung (air embolus).  Pregnancy following the procedure.  Failure of the procedure to help the problem, requiring hysterectomy.  Decreased ability to diagnose cancer in the lining of  the uterus. BEFORE THE PROCEDURE  The lining of the uterus must be tested to make sure there is no pre-cancerous or cancer cells present.  An ultrasound may be performed to look at the size of the uterus and to check for abnormalities.  Medicines may be given to thin the lining of the uterus. PROCEDURE  During the procedure, your health care provider will use a tool called a resectoscope to help see inside your uterus. There are different ways to remove the lining of your uterus.   Radiofrequency  This method uses a radiofrequency-alternating electric current to remove the lining of the uterus.  Cryotherapy This method uses extreme cold to freeze the lining of the uterus.  Heated-Free Liquid  This method uses heated salt (saline) solution to remove the lining of the uterus.  Microwave This method uses high-energy microwaves to heat up the lining of the uterus to remove it.  Thermal balloon  This method involves inserting a catheter with a balloon tip into the uterus. The balloon tip is filled with heated fluid to remove the lining of the uterus. AFTER THE PROCEDURE  After your procedure, do not have sexual intercourse or insert anything into your vagina until permitted by your health care provider. After the procedure, you may experience:  Cramps.  Vaginal discharge.  Frequent urination. Document Released: 04/18/2004 Document Revised: 02/09/2013 Document Reviewed: 11/10/2012 [facility] Patient Information 2014 [place] [place]. Return in 1 week to talk with JVF about ablation Physical in 1 year

## 2013-09-15 ENCOUNTER — Encounter (HOSPITAL_COMMUNITY): Payer: Self-pay | Admitting: Pharmacy Technician

## 2013-09-15 ENCOUNTER — Ambulatory Visit (INDEPENDENT_AMBULATORY_CARE_PROVIDER_SITE_OTHER): Payer: BC Managed Care – PPO | Admitting: Obstetrics and Gynecology

## 2013-09-15 ENCOUNTER — Other Ambulatory Visit: Payer: Self-pay | Admitting: Obstetrics and Gynecology

## 2013-09-15 ENCOUNTER — Encounter: Payer: Self-pay | Admitting: Obstetrics and Gynecology

## 2013-09-15 VITALS — BP 110/64 | Ht 69.0 in | Wt 202.2 lb

## 2013-09-15 DIAGNOSIS — N920 Excessive and frequent menstruation with regular cycle: Secondary | ICD-10-CM

## 2013-09-15 NOTE — H&P (Signed)
  [facility] Visit   Patient name: Eileen Nunez MRN [id number] Date of birth: 08/13/1980  Consult for endometrial ablation  CC & HPI:   Eileen Nunez is a 32 y.o. female presenting today for heavy and more frequent periods. She states her cycle has become less than 28 days. She has 3 heavy days and 3 light days. She reports needing a tampon and a pad overnight. She has a history of BTL. LMP was 08/21/13.  ROS:   Negative except noted above.  Pertinent History Reviewed:   Medical & Surgical Hx: Reviewed: Significant for  Past Medical History   Diagnosis  Date   .  Hypertension      during pregnancy   .  Diabetes mellitus without complication      gestational diabetes   .  Menorrhagia  09/07/2013     Has period about every 3 weeks and has cramp too    Past Surgical History   Procedure  Laterality  Date   .  Cesarean section       x 2   .  Tubal ligation     Medications: Reviewed & Updated - see associated section  Social History: Reviewed - reports that she has been smoking Cigarettes. She has been smoking about 6.00 packs per day. She has never used smokeless tobacco.  Objective Findings:   Vitals: BP 110/64  Ht 5' 9" (1.753 m)  Wt 202 lb 3.2 oz (91.717 kg)  BMI 29.85 kg/m2  LMP 08/21/2013  Physical Examination: General appearance - alert, well appearing, and in no distress and oriented to person, place, and time  Pelvic - VULVA: normal appearing vulva with no masses, tenderness or lesions,  VAGINA: normal appearing vagina with normal color and discharge, no lesions,  CERVIX: normal appearing cervix without discharge or lesions,  UTERUS: uterus is normal size, shape, consistency and nontender, well supported, anteflexed small uterus, 7-8 cm length. Adnexa normal. Confirmed by u/s by jvf.([place])  ADNEXA: normal adnexa in size, nontender and no masses  Assessment & Plan:   A:  1. Menorrhagia, heavy period.  P:  1. Hysteroscopy, D&C, endometrial ablation to be done next  week.     

## 2013-09-15 NOTE — Patient Instructions (Signed)

## 2013-09-15 NOTE — Progress Notes (Signed)
Scheduled Hyst D&C w/ Endometrial Ablation on 09-22-13 @7 :30

## 2013-09-15 NOTE — Progress Notes (Signed)
This chart was scribed by Leone PayorSonum Patel, Medical Scribe, for Dr. Christin BachJohn Keria Widrig on 09/15/13 at 12:24 PM. This chart was reviewed by Dr. Christin BachJohn Estephan Gallardo and is accurate.   [facility] Visit  Patient name: Eileen Nunez MRN [id number]  Date of birth: 09-15-1980 Consult for endometrial ablation CC & HPI:  Eileen Nunez is a 33 y.o. female presenting today for heavy and more frequent periods. She states her cycle has become less than 28 days. She has 3 heavy days and 3 light days. She reports needing a tampon and a pad overnight. She has a history of BTL. LMP was 08/21/13.   ROS:  Negative except noted above.   Pertinent History Reviewed:  Medical & Surgical Hx:  Reviewed: Significant for   Past Medical History  Diagnosis Date  . Hypertension     during pregnancy  . Diabetes mellitus without complication     gestational diabetes  . Menorrhagia 09/07/2013    Has period about every 3 weeks and has cramp too    Past Surgical History  Procedure Laterality Date  . Cesarean section      x 2  . Tubal ligation      Medications: Reviewed & Updated - see associated section Social History: Reviewed -  reports that she has been smoking Cigarettes.  She has been smoking about 6.00 packs per day. She has never used smokeless tobacco.  Objective Findings:  Vitals: BP 110/64  Ht 5\' 9"  (1.753 m)  Wt 202 lb 3.2 oz (91.717 kg)  BMI 29.85 kg/m2  LMP 08/21/2013  Physical Examination: General appearance - alert, well appearing, and in no distress and oriented to person, place, and time Pelvic - VULVA: normal appearing vulva with no masses, tenderness or lesions,  VAGINA: normal appearing vagina with normal color and discharge, no lesions,  CERVIX: normal appearing cervix without discharge or lesions,  UTERUS: uterus is normal size, shape, consistency and nontender, well supported, anteflexed small uterus, 7-8 cm length.  Adnexa normal. Confirmed by u/s by jvf.([place]) ADNEXA: normal adnexa in size,  nontender and no masses    Assessment & Plan:   A:  1. Menorrhagia, heavy period.   P:  1. Hysteroscopy, D&C, endometrial ablation to be done next week.

## 2013-09-16 ENCOUNTER — Encounter (HOSPITAL_COMMUNITY)
Admission: RE | Admit: 2013-09-16 | Discharge: 2013-09-16 | Disposition: A | Discharge status: Home / Self Care | Payer: BC Managed Care – PPO | Source: Ambulatory Visit | Attending: Obstetrics and Gynecology | Admitting: Obstetrics and Gynecology

## 2013-09-16 ENCOUNTER — Other Ambulatory Visit: Payer: Self-pay

## 2013-09-16 ENCOUNTER — Encounter (HOSPITAL_COMMUNITY): Payer: Self-pay

## 2013-09-16 DIAGNOSIS — Z01812 Encounter for preprocedural laboratory examination: Secondary | ICD-10-CM | POA: Insufficient documentation

## 2013-09-16 DIAGNOSIS — Z01810 Encounter for preprocedural cardiovascular examination: Secondary | ICD-10-CM | POA: Insufficient documentation

## 2013-09-16 HISTORY — DX: Anxiety disorder, unspecified: F41.9

## 2013-09-16 LAB — BASIC METABOLIC PANEL
BUN: 11 mg/dL (ref 6–23)
CHLORIDE: 103 meq/L (ref 96–112)
CO2: 29 meq/L (ref 19–32)
Calcium: 9.1 mg/dL (ref 8.4–10.5)
Creatinine, Ser: 1.13 mg/dL — ABNORMAL HIGH (ref 0.50–1.10)
GFR calc Af Amer: 73 mL/min — ABNORMAL LOW (ref >90)
GFR calc non Af Amer: 63 mL/min — ABNORMAL LOW (ref >90)
Glucose, Bld: 90 mg/dL (ref 70–99)
Potassium: 4 mEq/L (ref 3.7–5.3)
SODIUM: 143 meq/L (ref 137–147)

## 2013-09-16 LAB — CBC
HEMATOCRIT: 42.3 % (ref 36.0–46.0)
Hemoglobin: 14.3 g/dL (ref 12.0–15.0)
MCH: 30 pg (ref 26.0–34.0)
MCHC: 33.8 g/dL (ref 30.0–36.0)
MCV: 88.9 fL (ref 78.0–100.0)
PLATELETS: 213 10*3/uL (ref 150–400)
RBC: 4.76 MIL/uL (ref 3.87–5.11)
RDW: 16.2 % — ABNORMAL HIGH (ref 11.5–15.5)
WBC: 9.2 10*3/uL (ref 4.0–10.5)

## 2013-09-16 LAB — HCG, SERUM, QUALITATIVE: Preg, Serum: NEGATIVE

## 2013-09-16 NOTE — Pre-Procedure Instructions (Signed)
Patient given information to sign up for my chart at home. 

## 2013-09-16 NOTE — Patient Instructions (Signed)
[name]  09/16/2013   Your procedure is scheduled on:  09/20/2013  [facility] at  730  AM.  Call this number if you have problems the morning of surgery: [phone]   Remember:   Do not eat food or drink liquids after midnight.   Take these medicines the morning of surgery with A SIP OF WATER:  Wellbutrin, maxzide   Do not wear jewelry, make-up or nail polish.  Do not wear lotions, powders, or perfumes.   Do not shave 48 hours prior to surgery. Men may shave face and neck.  Do not bring valuables to the hospital.  [facility] not [facility] any belongings or valuables.               Contacts, dentures or bridgework may not be worn into surgery.  Leave suitcase in the car. After surgery it may be brought to your room.  For patients admitted to the hospital, discharge time is determined by your treatment team.               Patients discharged the day of surgery will not be allowed to drive home.  Name and phone number of your driver: family Special Instructions: Shower using CHG 2 nights before surgery and the night before surgery.  If you shower the day of surgery use CHG.  Use special wash - you have one bottle of CHG for all showers.  You should use approximately 1/3 of the bottle for each shower.   Please read over the following fact sheets that you were given: Pain Booklet, Coughing and Deep Breathing, Surgical Site Infection Prevention, Anesthesia Post-op Instructions and Care and Recovery After Surgery Hysteroscopy Hysteroscopy is a procedure used for looking inside the womb (uterus). It may be done for various reasons, including:  To evaluate abnormal bleeding, fibroid (benign, noncancerous) tumors, polyps, scar tissue (adhesions), and possibly cancer of the uterus.  To look for lumps (tumors) and other uterine growths.  To look for causes of why a woman cannot get pregnant (infertility), causes of recurrent loss of pregnancy  (miscarriages), or a lost intrauterine device (IUD).  To perform a sterilization by blocking the fallopian tubes from inside the uterus. In this procedure, a thin, flexible tube with a tiny light and camera on the end of it (hysteroscope) is used to look inside the uterus. A hysteroscopy should be done right after a menstrual period to be sure you are not pregnant. LET [facility] KNOW ABOUT:   Any allergies you have.  All medicines you are taking, including vitamins, herbs, eye drops, creams, and over-the-counter medicines.  Previous problems you or members of your family have had with the use of anesthetics.  Any blood disorders you have.  Previous surgeries you have had.  Medical conditions you have. RISKS AND COMPLICATIONS  Generally, this is a safe procedure. However, as with any procedure, complications can occur. Possible complications include:  Putting a hole in the uterus.  Excessive bleeding.  Infection.  Damage to the cervix.  Injury to other organs.  Allergic reaction to medicines.  Too much fluid used in the uterus for the procedure. BEFORE THE PROCEDURE   Ask your health care provider about changing or stopping any regular medicines.  Do not take aspirin or blood thinners for 1 week before the procedure, or as directed by your health care provider. These can cause bleeding.  If you smoke, do not smoke for 2 weeks before the procedure.  In some cases, a medicine is placed in the cervix the day before the procedure. This medicine makes the cervix have a larger opening (dilate). This makes it easier for the instrument to be inserted into the uterus during the procedure.  Do not eat or drink anything for at least 8 hours before the surgery.  Arrange for someone to take you home after the procedure. PROCEDURE   You may be given a medicine to relax you (sedative). You may also be given one of the following:  A medicine that numbs the area around  the cervix (local anesthetic).  A medicine that makes you sleep through the procedure (general anesthetic).  The hysteroscope is inserted through the vagina into the uterus. The camera on the hysteroscope sends a picture to a TV screen. This gives the surgeon a good view inside the uterus.  During the procedure, air or a liquid is put into the uterus, which allows the surgeon to see better.  Sometimes, tissue is gently scraped from inside the uterus. These tissue samples are sent to a lab for testing. AFTER THE PROCEDURE   If you had a general anesthetic, you may be groggy for a couple hours after the procedure.  If you had a local anesthetic, you will be able to go home as soon as you are stable and feel ready.  You may have some cramping. This normally lasts for a couple days.  You may have bleeding, which varies from light spotting for a few days to menstrual-like bleeding for 3 7 days. This is normal.  If your test results are not back during the visit, make an appointment with your health care provider to find out the results. Document Released: 09/15/2000 Document Revised: 03/30/2013 Document Reviewed: 01/06/2013 [facility] Patient Information 2014 [place], [place]. Endometrial Ablation Endometrial ablation removes the lining of the uterus (endometrium). It is usually a same-day, outpatient treatment. Ablation helps avoid major surgery, such as surgery to remove the cervix and uterus (hysterectomy). After endometrial ablation, you will have little or no menstrual bleeding and may not be able to have children. However, if you are premenopausal, you will need to use a reliable method of birth control following the procedure because of the small chance that pregnancy can occur. There are different reasons to have this procedure, which include:  Heavy periods.  Bleeding that is causing anemia.  Irregular bleeding.  Bleeding fibroids on the lining inside the uterus if they are smaller  than 3 centimeters. This procedure should not be done if:  You want children in the future.  You have severe cramps with your menstrual period.  You have precancerous or cancerous cells in your uterus.  You were recently pregnant.  You have gone through menopause.  You have had major surgery on the uterus, such as a cesarean delivery. LET [facility] CARE PROVIDER KNOW ABOUT:  Any allergies you have.  All medicines you are taking, including vitamins, herbs, eye drops, creams, and over-the-counter medicines.  Previous problems you or members of your family have had with the use of anesthetics.  Any blood disorders you have.  Previous surgeries you have had.  Medical conditions you have. RISKS AND COMPLICATIONS  Generally, this is a safe procedure. However, as with any procedure, complications can occur. Possible complications include:  Perforation of the uterus.  Bleeding.  Infection of the uterus, bladder, or vagina.  Injury to surrounding organs.  An air bubble to the lung (air embolus).  Pregnancy following the procedure.  Failure of the procedure to help the problem, requiring hysterectomy.  Decreased ability to diagnose cancer in the lining of the uterus. BEFORE THE PROCEDURE  The lining of the uterus must be tested to make sure there is no pre-cancerous or cancer cells present.  An ultrasound may be performed to look at the size of the uterus and to check for abnormalities.  Medicines may be given to thin the lining of the uterus. PROCEDURE  During the procedure, your health care provider will use a tool called a resectoscope to help see inside your uterus. There are different ways to remove the lining of your uterus.   Radiofrequency  This method uses a radiofrequency-alternating electric current to remove the lining of the uterus.  Cryotherapy This method uses extreme cold to freeze the lining of the uterus.  Heated-Free Liquid  This method uses  heated salt (saline) solution to remove the lining of the uterus.  Microwave This method uses high-energy microwaves to heat up the lining of the uterus to remove it.  Thermal balloon  This method involves inserting a catheter with a balloon tip into the uterus. The balloon tip is filled with heated fluid to remove the lining of the uterus. AFTER THE PROCEDURE  After your procedure, do not have sexual intercourse or insert anything into your vagina until permitted by your health care provider. After the procedure, you may experience:  Cramps.  Vaginal discharge.  Frequent urination. Document Released: 04/18/2004 Document Revised: 02/09/2013 Document Reviewed: 11/10/2012 [facility] Patient Information 2014 [place], [place]. Hysteroscopy Hysteroscopy is a procedure used for looking inside the womb (uterus). It may be done for various reasons, including:  To evaluate abnormal bleeding, fibroid (benign, noncancerous) tumors, polyps, scar tissue (adhesions), and possibly cancer of the uterus.  To look for lumps (tumors) and other uterine growths.  To look for causes of why a woman cannot get pregnant (infertility), causes of recurrent loss of pregnancy (miscarriages), or a lost intrauterine device (IUD).  To perform a sterilization by blocking the fallopian tubes from inside the uterus. In this procedure, a thin, flexible tube with a tiny light and camera on the end of it (hysteroscope) is used to look inside the uterus. A hysteroscopy should be done right after a menstrual period to be sure you are not pregnant. LET [facility] CARE PROVIDER KNOW ABOUT:   Any allergies you have.  All medicines you are taking, including vitamins, herbs, eye drops, creams, and over-the-counter medicines.  Previous problems you or members of your family have had with the use of anesthetics.  Any blood disorders you have.  Previous surgeries you have had.  Medical conditions you have. RISKS AND  COMPLICATIONS  Generally, this is a safe procedure. However, as with any procedure, complications can occur. Possible complications include:  Putting a hole in the uterus.  Excessive bleeding.  Infection.  Damage to the cervix.  Injury to other organs.  Allergic reaction to medicines.  Too much fluid used in the uterus for the procedure. BEFORE THE PROCEDURE   Ask your health care provider about changing or stopping any regular medicines.  Do not take aspirin or blood thinners for 1 week before the procedure, or as directed by your health care provider. These can cause bleeding.  If you smoke, do not smoke for 2 weeks before the procedure.  In some cases, a medicine is placed in the cervix the day  before the procedure. This medicine makes the cervix have a larger opening (dilate). This makes it easier for the instrument to be inserted into the uterus during the procedure.  Do not eat or drink anything for at least 8 hours before the surgery.  Arrange for someone to take you home after the procedure. PROCEDURE   You may be given a medicine to relax you (sedative). You may also be given one of the following:  A medicine that numbs the area around the cervix (local anesthetic).  A medicine that makes you sleep through the procedure (general anesthetic).  The hysteroscope is inserted through the vagina into the uterus. The camera on the hysteroscope sends a picture to a TV screen. This gives the surgeon a good view inside the uterus.  During the procedure, air or a liquid is put into the uterus, which allows the surgeon to see better.  Sometimes, tissue is gently scraped from inside the uterus. These tissue samples are sent to a lab for testing. AFTER THE PROCEDURE   If you had a general anesthetic, you may be groggy for a couple hours after the procedure.  If you had a local anesthetic, you will be able to go home as soon as you are stable and feel ready.  You may have  some cramping. This normally lasts for a couple days.  You may have bleeding, which varies from light spotting for a few days to menstrual-like bleeding for 3 7 days. This is normal.  If your test results are not back during the visit, make an appointment with your health care provider to find out the results. Document Released: 09/15/2000 Document Revised: 03/30/2013 Document Reviewed: 01/06/2013 [facility] Patient Information 2014 [place], [place]. Dilation and Curettage or Vacuum Curettage Dilation and curettage (D&C) and vacuum curettage are minor procedures. A D&C involves stretching (dilation) the cervix and scraping (curettage) the inside lining of the womb (uterus). During a D&C, tissue is gently scraped from the inside lining of the uterus. During a vacuum curettage, the lining and tissue in the uterus are removed with the use of gentle suction.  Curettage may be performed to either diagnose or treat a problem. As a diagnostic procedure, curettage is performed to examine tissues from the uterus. A diagnostic curettage may be performed for the following symptoms:   Irregular bleeding in the uterus.   Bleeding with the development of clots.   Spotting between menstrual periods.   Prolonged menstrual periods.   Bleeding after menopause.   No menstrual period (amenorrhea).   A change in size and shape of the uterus.  As a treatment procedure, curettage may be performed for the following reasons:   Removal of an IUD (intrauterine device).   Removal of retained placenta after giving birth. Retained placenta can cause an infection or bleeding severe enough to require transfusions.   Abortion.   Miscarriage.   Removal of polyps inside the uterus.   Removal of uncommon types of noncancerous lumps (fibroids).  LET [facility] CARE PROVIDER KNOW ABOUT:   Any allergies you have.   All medicines you are taking, including vitamins, herbs, eye drops, creams, and  over-the-counter medicines.   Previous problems you or members of your family have had with the use of anesthetics.   Any blood disorders you have.   Previous surgeries you have had.   Medical conditions you have. RISKS AND COMPLICATIONS  Generally, this is a safe procedure. However, as with any procedure, complications can occur. Possible complications  include:  Excessive bleeding.   Infection of the uterus.   Damage to the cervix.   Development of scar tissue (adhesions) inside the uterus, later causing abnormal amounts of menstrual bleeding.   Complications from the general anesthetic, if a general anesthetic is used.   Putting a hole (perforation) in the uterus. This is rare.  BEFORE THE PROCEDURE   Eat and drink before the procedure only as directed by your health care provider.   Arrange for someone to take you home.  PROCEDURE  This procedure usually takes about 15 30 minutes.  You will be given one of the following:  A medicine that numbs the area in and around the cervix (local anesthetic).   A medicine to make you sleep through the procedure (general anesthetic).  You will lie on your back with your legs in stirrups.   A warm metal or plastic instrument (speculum) will be placed in your vagina to keep it open and to allow the health care provider to see the cervix.  There are two ways in which your cervix can be softened and dilated. These include:   Taking a medicine.   Having thin rods (laminaria) inserted into your cervix.   A curved tool (curette) will be used to scrape cells from the inside lining of the uterus. In some cases, gentle suction is applied with the curette. The curette will then be removed.  AFTER THE PROCEDURE   You will rest in the recovery area until you are stable and are ready to go home.   You may feel sick to your stomach (nauseous) or throw up (vomit) if you were given a general anesthetic.   You may have a  sore throat if a tube was placed in your throat during general anesthesia.   You may have light cramping and bleeding. This may last for 2 days to 2 weeks after the procedure.   Your uterus needs to make a new lining after the procedure. This may make your next period late. Document Released: 06/09/2005 Document Revised: 02/09/2013 Document Reviewed: 01/06/2013 [facility] Patient Information 2014 [place], [place]. PATIENT INSTRUCTIONS POST-ANESTHESIA  IMMEDIATELY FOLLOWING SURGERY:  Do not drive or operate machinery for the first twenty four hours after surgery.  Do not make any important decisions for twenty four hours after surgery or while taking narcotic pain medications or sedatives.  If you develop intractable nausea and vomiting or a severe headache please notify your doctor immediately.  FOLLOW-UP:  Please make an appointment with your surgeon as instructed. You do not need to follow up with anesthesia unless specifically instructed to do so.  WOUND CARE INSTRUCTIONS (if applicable):  Keep a dry clean dressing on the anesthesia/puncture wound site if there is drainage.  Once the wound has quit draining you may leave it open to air.  Generally you should leave the bandage intact for twenty four hours unless there is drainage.  If the epidural site drains for more than 36-48 hours please call the anesthesia department.  QUESTIONS?:  Please feel free to call your physician or the hospital operator if you have any questions, and they will be happy to assist you.

## 2013-09-20 ENCOUNTER — Encounter (HOSPITAL_COMMUNITY): Payer: Self-pay | Admitting: *Deleted

## 2013-09-20 ENCOUNTER — Encounter (HOSPITAL_COMMUNITY)
Admission: RE | Disposition: A | Discharge status: Home / Self Care | Payer: Self-pay | Source: Ambulatory Visit | Attending: Obstetrics and Gynecology

## 2013-09-20 ENCOUNTER — Encounter (HOSPITAL_COMMUNITY): Payer: BC Managed Care – PPO | Admitting: Anesthesiology

## 2013-09-20 ENCOUNTER — Ambulatory Visit (HOSPITAL_COMMUNITY): Payer: BC Managed Care – PPO | Admitting: Anesthesiology

## 2013-09-20 ENCOUNTER — Ambulatory Visit (HOSPITAL_COMMUNITY)
Admission: RE | Admit: 2013-09-20 | Discharge: 2013-09-20 | Disposition: A | Discharge status: Home / Self Care | Payer: BC Managed Care – PPO | Source: Ambulatory Visit | Attending: Obstetrics and Gynecology | Admitting: Obstetrics and Gynecology

## 2013-09-20 DIAGNOSIS — F17200 Nicotine dependence, unspecified, uncomplicated: Secondary | ICD-10-CM | POA: Insufficient documentation

## 2013-09-20 DIAGNOSIS — Z9889 Other specified postprocedural states: Secondary | ICD-10-CM

## 2013-09-20 DIAGNOSIS — N920 Excessive and frequent menstruation with regular cycle: Secondary | ICD-10-CM

## 2013-09-20 DIAGNOSIS — F411 Generalized anxiety disorder: Secondary | ICD-10-CM | POA: Insufficient documentation

## 2013-09-20 DIAGNOSIS — Z79899 Other long term (current) drug therapy: Secondary | ICD-10-CM | POA: Insufficient documentation

## 2013-09-20 DIAGNOSIS — I10 Essential (primary) hypertension: Secondary | ICD-10-CM | POA: Insufficient documentation

## 2013-09-20 HISTORY — PX: DILITATION & CURRETTAGE/HYSTROSCOPY WITH THERMACHOICE ABLATION: SHX5569

## 2013-09-20 SURGERY — DILATATION & CURETTAGE/HYSTEROSCOPY WITH THERMACHOICE ABLATION
Anesthesia: General | Site: Uterus

## 2013-09-20 MED ORDER — PROPOFOL 10 MG/ML IV EMUL
INTRAVENOUS | Status: AC
  Filled 2013-09-20: qty 20

## 2013-09-20 MED ORDER — FENTANYL CITRATE 0.05 MG/ML IJ SOLN
INTRAMUSCULAR | Status: AC
  Filled 2013-09-20: qty 2

## 2013-09-20 MED ORDER — KETOROLAC TROMETHAMINE 30 MG/ML IJ SOLN
30.0000 mg | Freq: Once | INTRAMUSCULAR | Status: AC
  Administered 2013-09-20: 30 mg via INTRAVENOUS

## 2013-09-20 MED ORDER — ONDANSETRON HCL 4 MG/2ML IJ SOLN
4.0000 mg | Freq: Once | INTRAMUSCULAR | Status: AC
  Administered 2013-09-20: 4 mg via INTRAVENOUS

## 2013-09-20 MED ORDER — ONDANSETRON HCL 4 MG/2ML IJ SOLN: 4.0000 mg | Freq: Once | INTRAMUSCULAR | Status: DC | PRN

## 2013-09-20 MED ORDER — SODIUM CHLORIDE 0.9 % IR SOLN
Status: DC | PRN
  Administered 2013-09-20: 3000 mL

## 2013-09-20 MED ORDER — BUPIVACAINE-EPINEPHRINE 0.5% -1:200000 IJ SOLN
INTRAMUSCULAR | Status: DC | PRN
  Administered 2013-09-20: 10 mL

## 2013-09-20 MED ORDER — ONDANSETRON HCL 4 MG/2ML IJ SOLN
INTRAMUSCULAR | Status: AC
  Filled 2013-09-20: qty 2

## 2013-09-20 MED ORDER — KETOROLAC TROMETHAMINE 15 MG/ML IJ SOLN: 30.0000 mg | Freq: Once | INTRAMUSCULAR | Status: DC

## 2013-09-20 MED ORDER — KETOROLAC TROMETHAMINE 10 MG PO TABS: 10.0000 mg | ORAL_TABLET | Freq: Four times a day (QID) | ORAL | Status: DC | PRN

## 2013-09-20 MED ORDER — FENTANYL CITRATE 0.05 MG/ML IJ SOLN
INTRAMUSCULAR | Status: AC
  Filled 2013-09-20: qty 5

## 2013-09-20 MED ORDER — SODIUM CHLORIDE 0.9 % IJ SOLN
INTRAMUSCULAR | Status: AC
  Filled 2013-09-20: qty 10

## 2013-09-20 MED ORDER — FENTANYL CITRATE 0.05 MG/ML IJ SOLN
25.0000 ug | INTRAMUSCULAR | Status: AC
  Administered 2013-09-20 (×2): 25 ug via INTRAVENOUS

## 2013-09-20 MED ORDER — LIDOCAINE HCL (PF) 1 % IJ SOLN
INTRAMUSCULAR | Status: AC
  Filled 2013-09-20: qty 5

## 2013-09-20 MED ORDER — GLYCOPYRROLATE 0.2 MG/ML IJ SOLN
0.2000 mg | Freq: Once | INTRAMUSCULAR | Status: AC
  Administered 2013-09-20: 0.2 mg via INTRAVENOUS

## 2013-09-20 MED ORDER — MIDAZOLAM HCL 2 MG/2ML IJ SOLN
1.0000 mg | INTRAMUSCULAR | Status: DC | PRN
  Administered 2013-09-20: 2 mg via INTRAVENOUS

## 2013-09-20 MED ORDER — BUPIVACAINE-EPINEPHRINE PF 0.5-1:200000 % IJ SOLN
INTRAMUSCULAR | Status: AC
  Filled 2013-09-20: qty 10

## 2013-09-20 MED ORDER — PROPOFOL 10 MG/ML IV BOLUS
INTRAVENOUS | Status: DC | PRN
  Administered 2013-09-20: 200 mg via INTRAVENOUS

## 2013-09-20 MED ORDER — PROMETHAZINE HCL 25 MG/ML IJ SOLN
INTRAMUSCULAR | Status: AC
  Filled 2013-09-20: qty 1

## 2013-09-20 MED ORDER — FENTANYL CITRATE 0.05 MG/ML IJ SOLN
INTRAMUSCULAR | Status: DC | PRN
  Administered 2013-09-20 (×2): 50 ug via INTRAVENOUS

## 2013-09-20 MED ORDER — MIDAZOLAM HCL 2 MG/2ML IJ SOLN
INTRAMUSCULAR | Status: AC
  Filled 2013-09-20: qty 2

## 2013-09-20 MED ORDER — OXYCODONE-ACETAMINOPHEN 5-325 MG PO TABS: 1.0000 | ORAL_TABLET | ORAL | Status: DC | PRN

## 2013-09-20 MED ORDER — KETOROLAC TROMETHAMINE 30 MG/ML IJ SOLN
INTRAMUSCULAR | Status: AC
  Filled 2013-09-20: qty 1

## 2013-09-20 MED ORDER — DEXTROSE 5 % IV SOLN
INTRAVENOUS | Status: DC | PRN
  Administered 2013-09-20: 500 mL via INTRAVENOUS

## 2013-09-20 MED ORDER — PROMETHAZINE HCL 25 MG/ML IJ SOLN
6.2500 mg | INTRAMUSCULAR | Status: DC | PRN
  Administered 2013-09-20: 6.25 mg via INTRAVENOUS

## 2013-09-20 MED ORDER — FENTANYL CITRATE 0.05 MG/ML IJ SOLN
25.0000 ug | INTRAMUSCULAR | Status: DC | PRN
  Administered 2013-09-20 (×3): 50 ug via INTRAVENOUS

## 2013-09-20 MED ORDER — LACTATED RINGERS IV SOLN
INTRAVENOUS | Status: DC
  Administered 2013-09-20: 08:00:00 via INTRAVENOUS

## 2013-09-20 MED ORDER — KETOROLAC TROMETHAMINE 15 MG/ML IJ SOLN: 15.0000 mg | Freq: Once | INTRAMUSCULAR | Status: DC

## 2013-09-20 MED ORDER — GLYCOPYRROLATE 0.2 MG/ML IJ SOLN
INTRAMUSCULAR | Status: AC
  Filled 2013-09-20: qty 1

## 2013-09-20 SURGICAL SUPPLY — 28 items
BAG DECANTER FOR FLEXI CONT (MISCELLANEOUS) ×3 IMPLANT
BAG HAMPER (MISCELLANEOUS) ×3 IMPLANT
CATH ROBINSON RED A/P 16FR (CATHETERS) ×3 IMPLANT
CATH THERMACHOICE III (CATHETERS) ×3 IMPLANT
CLOTH BEACON ORANGE TIMEOUT ST (SAFETY) ×3 IMPLANT
COVER LIGHT HANDLE STERIS (MISCELLANEOUS) ×6 IMPLANT
FORMALIN 10 PREFIL 120ML (MISCELLANEOUS) ×3 IMPLANT
GLOVE BIOGEL PI IND STRL 7.0 (GLOVE) ×1 IMPLANT
GLOVE BIOGEL PI IND STRL 9 (GLOVE) ×1 IMPLANT
GLOVE BIOGEL PI INDICATOR 7.0 (GLOVE) ×2
GLOVE BIOGEL PI INDICATOR 9 (GLOVE) ×2
GLOVE ECLIPSE 6.5 STRL STRAW (GLOVE) ×3 IMPLANT
GLOVE ECLIPSE 9.0 STRL (GLOVE) ×3 IMPLANT
GOWN SPEC L3 XXLG W/TWL (GOWN DISPOSABLE) ×6 IMPLANT
GOWN STRL REUS W/TWL LRG LVL3 (GOWN DISPOSABLE) ×6 IMPLANT
INST SET HYSTEROSCOPY (KITS) ×3 IMPLANT
IV D5W 500ML (IV SOLUTION) ×3 IMPLANT
IV NS 1000ML (IV SOLUTION) ×2
IV NS 1000ML BAXH (IV SOLUTION) ×1 IMPLANT
KIT ROOM TURNOVER AP CYSTO (KITS) ×3 IMPLANT
MANIFOLD NEPTUNE II (INSTRUMENTS) ×3 IMPLANT
NS IRRIG 1000ML POUR BTL (IV SOLUTION) ×3 IMPLANT
PACK PERI GYN (CUSTOM PROCEDURE TRAY) ×3 IMPLANT
PAD ARMBOARD 7.5X6 YLW CONV (MISCELLANEOUS) ×3 IMPLANT
PAD TELFA 3X4 1S STER (GAUZE/BANDAGES/DRESSINGS) ×3 IMPLANT
SET BASIN LINEN APH (SET/KITS/TRAYS/PACK) ×3 IMPLANT
SET IRRIG Y TYPE TUR BLADDER L (SET/KITS/TRAYS/PACK) ×3 IMPLANT
SYR CONTROL 10ML LL (SYRINGE) ×3 IMPLANT

## 2013-09-20 NOTE — Interval H&P Note (Signed)
History and Physical Interval Note:  09/20/2013 8:34 AM  Eileen PaganiniNicole B Smith  has presented today for surgery, with the diagnosis of menorrhagia  The various methods of treatment have been discussed with the patient and family. After consideration of risks, benefits and other options for treatment, the patient has consented to  Procedure(s): DILATATION & CURETTAGE/HYSTEROSCOPY WITH THERMACHOICE ABLATION (N/A) as a surgical intervention .  The patient's history has been reviewed, patient examined, no change in status, stable for surgery.  I have reviewed the patient's chart and labs.  Questions were answered to the patient's satisfaction.   Last menstrual period began last Friday, 09/16/13.  Tilda BurrowFERGUSON,Athanasius Kesling V

## 2013-09-20 NOTE — Brief Op Note (Signed)
09/20/2013  9:57 AM  PATIENT:  Anice PaganiniNicole B Smith  33 y.o. female  PRE-OPERATIVE DIAGNOSIS:  menorrhagia  POST-OPERATIVE DIAGNOSIS:  menorrhagia  PROCEDURE:  Procedure(s) with comments: DILATATION & CURETTAGE/HYSTEROSCOPY WITH THERMACHOICE ABLATION (N/A) - total therapy time: 9 minutes 30 seconds;  temperature:  87 degrees   SURGEON:  Surgeon(s) and Role:    * Tilda BurrowJohn V Liana Camerer, MD - Primary  PHYSICIAN ASSISTANT:   ASSISTANTS: Henderson, CST   ANESTHESIA:   general and paracervical block  EBL:  Total I/O In: 800 [I.V.:800] Out: 0   BLOOD ADMINISTERED:none  DRAINS: none   LOCAL MEDICATIONS USED:  MARCAINE    and Amount: 20 ml  SPECIMEN:  No Specimen  DISPOSITION OF SPECIMEN:  N/A  COUNTS:  YES  TOURNIQUET:  * No tourniquets in log *  DICTATION: .Dragon Dictation  PLAN OF CARE: Discharge to home after PACU  PATIENT DISPOSITION:  PACU - hemodynamically stable.   Delay start of Pharmacological VTE agent (>24hrs) due to surgical blood loss or risk of bleeding: not applicable

## 2013-09-20 NOTE — Transfer of Care (Signed)
Immediate Anesthesia Transfer of Care Note  Patient: Eileen Nunez  Procedure(s) Performed: Procedure(s) with comments: DILATATION & CURETTAGE/HYSTEROSCOPY WITH THERMACHOICE ABLATION (N/A) - total therapy time: 9 minutes 30 seconds;  temperature:  87 degrees   Patient Location: PACU  Anesthesia Type:General  Level of Consciousness: awake  Airway & Oxygen Therapy: Patient Spontanous Breathing and Patient connected to face mask oxygen  Post-op Assessment: Report given to PACU RN  Post vital signs: Reviewed  Complications: No apparent anesthesia complications

## 2013-09-20 NOTE — Anesthesia Procedure Notes (Addendum)
Procedure Name: LMA Insertion Date/Time: 09/20/2013 9:17 AM Performed by: Glynn OctaveANIEL, Eliott Amparan E Pre-anesthesia Checklist: Patient identified, Patient being monitored, Emergency Drugs available, Timeout performed and Suction available Patient Re-evaluated:Patient Re-evaluated prior to inductionOxygen Delivery Method: Circle System Utilized Preoxygenation: Pre-oxygenation with 100% oxygen Intubation Type: IV induction Ventilation: Mask ventilation without difficulty LMA: LMA inserted LMA Size: 4.0 Number of attempts: 1 Placement Confirmation: positive ETCO2 and breath sounds checked- equal and bilateral

## 2013-09-20 NOTE — Progress Notes (Signed)
Pt had 20g IV in rt hand that was discontinued in post op on 09/20/13 at 1235, catheter tip intact. Pt tolerated it very well. IV was never charted in doc flowsheets.

## 2013-09-20 NOTE — Anesthesia Preprocedure Evaluation (Signed)
Anesthesia Evaluation  Patient identified by MRN, date of birth, ID band Patient awake    Reviewed: Allergy & Precautions, H&P , NPO status , Patient's Chart, lab work & pertinent test results  Airway Mallampati: I TM Distance: >3 FB Neck ROM: Full    Dental  (+) Teeth Intact   Pulmonary Current Smoker,  breath sounds clear to auscultation        Cardiovascular hypertension, Pt. on medications Rhythm:Regular Rate:Normal     Neuro/Psych PSYCHIATRIC DISORDERS Anxiety    GI/Hepatic negative GI ROS,   Endo/Other  diabetes, Gestational  Renal/GU      Musculoskeletal   Abdominal   Peds  Hematology   Anesthesia Other Findings   Reproductive/Obstetrics                           Anesthesia Physical Anesthesia Plan  ASA: II  Anesthesia Plan: General   Post-op Pain Management:    Induction: Intravenous  Airway Management Planned: LMA  Additional Equipment:   Intra-op Plan:   Post-operative Plan: Extubation in OR  Informed Consent: I have reviewed the patients History and Physical, chart, labs and discussed the procedure including the risks, benefits and alternatives for the proposed anesthesia with the patient or authorized representative who has indicated his/her understanding and acceptance.     Plan Discussed with:   Anesthesia Plan Comments:         Anesthesia Quick Evaluation

## 2013-09-20 NOTE — Op Note (Signed)
09/20/2013  9:57 AM  PATIENT:  Anice PaganiniNicole B Smith  33 y.o. female  PRE-OPERATIVE DIAGNOSIS:  menorrhagia  POST-OPERATIVE DIAGNOSIS:  menorrhagia  PROCEDURE:  Procedure(s) with comments: DILATATION & CURETTAGE/HYSTEROSCOPY WITH THERMACHOICE ABLATION (N/A) - total therapy time: 9 minutes 30 seconds;  temperature:  87 degrees   SURGEON:  Surgeon(s) and Role:    * Tilda BurrowJohn V Severa Jeremiah, MD - Primary  PHYSICIAN ASSISTANT:   ASSISTANTS: Henderson, CST   ANESTHESIA:   general and paracervical block  EBL:  Total I/O In: 800 [I.V.:800] Out: 0   Details of procedure: Patient was taken operating room prepped and draped for vaginal procedure. Timeout was conducted. Operative team confirmed procedure. Speculum was inserted, cervix grasped with single-tooth tenaculum the uterus sounded to 8 cm in the anteflexed position ,, and then dilated to 23 [place] using serial dilators. The hysteroscopy rigid 30 scope was then used to confirm the endometrial cavity is pink and uniform without any tissue areas. Tubal ostia were visualized bilaterally. There was no suspicion of perforation. Smooth sharp curettage was briefly performed and no significant tissue obtained, as the patient was finishing her menstrual period. The Gynecare ThermaChoice 3 endometrial ablation device was prepared , inserted filled with 16 cc of D5W achieving 150 mm mercury pressure, followed by an 8 minute thermal ablation sequence which was completed uneventfully. The fluid was recovered post procedure, and paracervical block using 20 cc of D5W was completed. Sponge and needle counts were correct patient to recovery in stable condition

## 2013-09-20 NOTE — H&P (View-Only) (Signed)
  [facility] Visit   Patient name: Eileen Nunez MRN [id number] Date of birth: September 08, 1980  Consult for endometrial ablation  CC & HPI:   Eileen Nunez is a 33 y.o. female presenting today for heavy and more frequent periods. She states her cycle has become less than 28 days. She has 3 heavy days and 3 light days. She reports needing a tampon and a pad overnight. She has a history of BTL. LMP was 08/21/13.  ROS:   Negative except noted above.  Pertinent History Reviewed:   Medical & Surgical Hx: Reviewed: Significant for  Past Medical History   Diagnosis  Date   .  Hypertension      during pregnancy   .  Diabetes mellitus without complication      gestational diabetes   .  Menorrhagia  09/07/2013     Has period about every 3 weeks and has cramp too    Past Surgical History   Procedure  Laterality  Date   .  Cesarean section       x 2   .  Tubal ligation     Medications: Reviewed & Updated - see associated section  Social History: Reviewed - reports that she has been smoking Cigarettes. She has been smoking about 6.00 packs per day. She has never used smokeless tobacco.  Objective Findings:   Vitals: BP 110/64  Ht 5\' 9"  (1.753 m)  Wt 202 lb 3.2 oz (91.717 kg)  BMI 29.85 kg/m2  LMP 08/21/2013  Physical Examination: General appearance - alert, well appearing, and in no distress and oriented to person, place, and time  Pelvic - VULVA: normal appearing vulva with no masses, tenderness or lesions,  VAGINA: normal appearing vagina with normal color and discharge, no lesions,  CERVIX: normal appearing cervix without discharge or lesions,  UTERUS: uterus is normal size, shape, consistency and nontender, well supported, anteflexed small uterus, 7-8 cm length. Adnexa normal. Confirmed by u/s by jvf.()  ADNEXA: normal adnexa in size, nontender and no masses  Assessment & Plan:   A:  1. Menorrhagia, heavy period.  P:  1. Hysteroscopy, D&C, endometrial ablation to be done next  week.

## 2013-09-20 NOTE — Discharge Instructions (Signed)
Dilation and Curettage or Vacuum Curettage, Care After Refer to this sheet in the next few weeks. These instructions provide you with information on caring for yourself after your procedure. Your health care provider may also give you more specific instructions. Your treatment has been planned according to current medical practices, but problems sometimes occur. Call your health care provider if you have any problems or questions after your procedure. WHAT TO EXPECT AFTER THE PROCEDURE After your procedure, it is typical to have light cramping and bleeding. This may last for 2 days to 2 weeks after the procedure. HOME CARE INSTRUCTIONS   Do not drive for 24 hours.  Wait 1 week before returning to strenuous activities.  Take your temperature 2 times a day for 4 days and write it down. Provide these temperatures to your health care provider if you develop a fever.  Avoid long periods of standing.  Avoid heavy lifting, pushing, or pulling. Do not lift anything heavier than 10 pounds (4.5 kg).  Limit stair climbing to once or twice a day.  Take rest periods often.  You may resume your usual diet.  Drink enough fluids to keep your urine clear or pale yellow.  Your usual bowel function should return. If you have constipation, you may:  Take a mild laxative with permission from your health care provider.  Add fruit and bran to your diet.  Drink more fluids.  Take showers instead of baths until your health care provider gives you permission to take baths.  Do not go swimming or use a hot tub until your health care provider approves.  Try to have someone with you or available to you the first 24 48 hours, especially if you were given a general anesthetic.  Do not douche, use tampons, or have intercourse for 2 weeks after the procedure.  Only take over-the-counter or prescription medicines as directed by your health care provider. Do not take aspirin. It can cause bleeding.  Follow up  with your health care provider as directed. SEEK MEDICAL CARE IF:   You have increasing cramps or pain that is not relieved with medicine.  You have abdominal pain that does not seem to be related to the same area of earlier cramping and pain.  You have bad smelling vaginal discharge.  You have a rash.  You are having problems with any medicine. SEEK IMMEDIATE MEDICAL CARE IF:   You have bleeding that is heavier than a normal menstrual period.  You have a fever.  You have chest pain.  You have shortness of breath.  You feel dizzy or feel like fainting.  You pass out.  You have pain in your shoulder strap area.  You have heavy vaginal bleeding with or without blood clots. Document Released: 06/06/2000 Document Revised: 03/30/2013 Document Reviewed: 01/06/2013 [facility] Patient Information 2014 [place], [place].   Hysteroscopy, Care After Refer to this sheet in the next few weeks. These instructions provide you with information on caring for yourself after your procedure. Your health care provider may also give you more specific instructions. Your treatment has been planned according to current medical practices, but problems sometimes occur. Call your health care provider if you have any problems or questions after your procedure.  WHAT TO EXPECT AFTER THE PROCEDURE After your procedure, it is typical to have the following:  You may have some cramping. This normally lasts for a couple days.  You may have bleeding. This can vary from light spotting for a few days to  menstrual-like bleeding for 3 7 days. HOME CARE INSTRUCTIONS  Rest for the first 1 2 days after the procedure.  Only take over-the-counter or prescription medicines as directed by your health care provider. Do not take aspirin. It can increase the chances of bleeding.  Take showers instead of baths for 2 weeks or as directed by your health care provider.  Do not drive for 24 hours or as directed.  Do not  drink alcohol while taking pain medicine.  Do not use tampons, douche, or have sexual intercourse for 2 weeks or until your health care provider says it is okay.  Take your temperature twice a day for 4 5 days. Write it down each time.  Follow your health care provider's advice about diet, exercise, and lifting.  If you develop constipation, you may:  Take a mild laxative if your health care provider approves.  Add bran foods to your diet.  Drink enough fluids to keep your urine clear or pale yellow.  Try to have someone with you or available to you for the first 24 48 hours, especially if you were given a general anesthetic.  Follow up with your health care provider as directed. SEEK MEDICAL CARE IF:  You feel dizzy or lightheaded.  You feel sick to your stomach (nauseous).  You have abnormal vaginal discharge.  You have a rash.  You have pain that is not controlled with medicine. SEEK IMMEDIATE MEDICAL CARE IF:  You have bleeding that is heavier than a normal menstrual period.  You have a fever.  You have increasing cramps or pain, not controlled with medicine.  You have new belly (abdominal) pain.  You pass out.  You have pain in the tops of your shoulders (shoulder strap areas).  You have shortness of breath. Document Released: 03/30/2013 Document Reviewed: 01/06/2013 [facility] Patient Information 2014 [place], [place].     Endometrial Ablation Endometrial ablation removes the lining of the uterus (endometrium). It is usually a same-day, outpatient treatment. Ablation helps avoid major surgery, such as surgery to remove the cervix and uterus (hysterectomy). After endometrial ablation, you will have little or no menstrual bleeding and may not be able to have children. However, if you are premenopausal, you will need to use a reliable method of birth control following the procedure because of the small chance that pregnancy can occur. There are different reasons  to have this procedure, which include:  Heavy periods.  Bleeding that is causing anemia.  Irregular bleeding.  Bleeding fibroids on the lining inside the uterus if they are smaller than 3 centimeters. This procedure should not be done if:  You want children in the future.  You have severe cramps with your menstrual period.  You have precancerous or cancerous cells in your uterus.  You were recently pregnant.  You have gone through menopause.  You have had major surgery on the uterus, such as a cesarean delivery. LET [facility] CARE PROVIDER KNOW ABOUT:  Any allergies you have.  All medicines you are taking, including vitamins, herbs, eye drops, creams, and over-the-counter medicines.  Previous problems you or members of your family have had with the use of anesthetics.  Any blood disorders you have.  Previous surgeries you have had.  Medical conditions you have. RISKS AND COMPLICATIONS  Generally, this is a safe procedure. However, as with any procedure, complications can occur. Possible complications include:  Perforation of the uterus.  Bleeding.  Infection of the uterus, bladder, or vagina.  Injury to surrounding  organs.  An air bubble to the lung (air embolus).  Pregnancy following the procedure.  Failure of the procedure to help the problem, requiring hysterectomy.  Decreased ability to diagnose cancer in the lining of the uterus. BEFORE THE PROCEDURE  The lining of the uterus must be tested to make sure there is no pre-cancerous or cancer cells present.  An ultrasound may be performed to look at the size of the uterus and to check for abnormalities.  Medicines may be given to thin the lining of the uterus. PROCEDURE  During the procedure, your health care provider will use a tool called a resectoscope to help see inside your uterus. There are different ways to remove the lining of your uterus.   Radiofrequency  This method uses a  radiofrequency-alternating electric current to remove the lining of the uterus.  Cryotherapy This method uses extreme cold to freeze the lining of the uterus.  Heated-Free Liquid  This method uses heated salt (saline) solution to remove the lining of the uterus.  Microwave This method uses high-energy microwaves to heat up the lining of the uterus to remove it.  Thermal balloon  This method involves inserting a catheter with a balloon tip into the uterus. The balloon tip is filled with heated fluid to remove the lining of the uterus. AFTER THE PROCEDURE  After your procedure, do not have sexual intercourse or insert anything into your vagina until permitted by your health care provider. After the procedure, you may experience:  Cramps.  Vaginal discharge.  Frequent urination. Document Released: 04/18/2004 Document Revised: 02/09/2013 Document Reviewed: 11/10/2012 [facility] Patient Information 2014 [place].    PATIENT INSTRUCTIONS POST-ANESTHESIA  IMMEDIATELY FOLLOWING SURGERY:  Do not drive or operate machinery for the first twenty four hours after surgery.  Do not make any important decisions for twenty four hours after surgery or while taking narcotic pain medications or sedatives.  If you develop intractable nausea and vomiting or a severe headache please notify your doctor immediately.  FOLLOW-UP:  Please make an appointment with your surgeon as instructed. You do not need to follow up with anesthesia unless specifically instructed to do so.  WOUND CARE INSTRUCTIONS (if applicable):  Keep a dry clean dressing on the anesthesia/puncture wound site if there is drainage.  Once the wound has quit draining you may leave it open to air.  Generally you should leave the bandage intact for twenty four hours unless there is drainage.  If the epidural site drains for more than 36-48 hours please call the anesthesia department.  QUESTIONS?:  Please feel free to call your physician or  the hospital operator if you have any questions, and they will be happy to assist you.

## 2013-09-20 NOTE — Anesthesia Postprocedure Evaluation (Signed)
  Anesthesia Post-op Note  Patient: Eileen Nunez  Procedure(s) Performed: Procedure(s) with comments: DILATATION & CURETTAGE/HYSTEROSCOPY WITH THERMACHOICE ABLATION (N/A) - total therapy time: 9 minutes 30 seconds;  temperature:  87 degrees   Patient Location: PACU  Anesthesia Type:General  Level of Consciousness: awake and alert   Airway and Oxygen Therapy: Patient Spontanous Breathing and Patient connected to face mask oxygen  Post-op Pain: none  Post-op Assessment: Post-op Vital signs reviewed, Patient's Cardiovascular Status Stable, Respiratory Function Stable, Patent Airway and No signs of Nausea or vomiting  Post-op Vital Signs: Reviewed and stable  Complications: No apparent anesthesia complications

## 2013-09-21 ENCOUNTER — Encounter (HOSPITAL_COMMUNITY): Payer: Self-pay | Admitting: Obstetrics and Gynecology

## 2013-09-29 ENCOUNTER — Other Ambulatory Visit: Payer: Self-pay | Admitting: Nurse Practitioner

## 2013-09-29 ENCOUNTER — Encounter: Payer: Self-pay | Admitting: Obstetrics and Gynecology

## 2013-09-29 ENCOUNTER — Ambulatory Visit (INDEPENDENT_AMBULATORY_CARE_PROVIDER_SITE_OTHER): Payer: BC Managed Care – PPO | Admitting: Obstetrics and Gynecology

## 2013-09-29 VITALS — BP 112/70 | Ht 69.0 in | Wt 203.8 lb

## 2013-09-29 DIAGNOSIS — N920 Excessive and frequent menstruation with regular cycle: Secondary | ICD-10-CM

## 2013-09-29 DIAGNOSIS — Z9889 Other specified postprocedural states: Secondary | ICD-10-CM

## 2013-09-29 NOTE — Progress Notes (Signed)
   Subjective:  Eileen Nunez is a 33 y.o. female who presents to the clinic 1 week status post bilateral salpingectomy.    Review of Systems Negative except light pink discharge which has improved to a translucent discharge.   She has been eating a regular diet without difficulty.   Bowel movements are normal. Pain is controlled without any medications. She reports only one day of pain medication.   Objective:  BP 112/70  Ht 5\' 9"  (1.753 m)  Wt 203 lb 12.8 oz (92.443 kg)  BMI 30.08 kg/m2  LMP 09/16/2013 General:Well developed, well nourished.  No acute distress. Abdomen: Bowel sounds normal, soft, non-tender. Pelvic Exam:    External Genitalia:  Normal.    Vagina: Light watery pink discharge    Bimanual: Normal    Cervix: Normal    Uterus: Normal    Adnexa: Normal  Incision(s):   Healing well, no drainage, no erythema, no hernia, no swelling, no dehiscence, incision well approximated.   Assessment:  Post-Op 1 weeks s/p bilateral salpingectomy    Doing well postoperatively.   Plan:  1.Wound care discussed. Discussed post-op discomfort measures.  2. .Continue any current medications. 3. Activity restrictions: none 4. return to work: now. 5. Follow up in 1 year or PRN.

## 2013-09-29 NOTE — Patient Instructions (Signed)
Thank you for letting us care for you. Call for any concerns.

## 2013-11-08 ENCOUNTER — Ambulatory Visit (HOSPITAL_COMMUNITY)
Admission: RE | Admit: 2013-11-08 | Discharge: 2013-11-08 | Disposition: A | Discharge status: Home / Self Care | Payer: BC Managed Care – PPO | Source: Ambulatory Visit | Attending: Orthopedic Surgery | Admitting: Orthopedic Surgery

## 2013-11-08 DIAGNOSIS — M25569 Pain in unspecified knee: Secondary | ICD-10-CM | POA: Insufficient documentation

## 2013-11-08 DIAGNOSIS — M6281 Muscle weakness (generalized): Secondary | ICD-10-CM | POA: Insufficient documentation

## 2013-11-08 DIAGNOSIS — IMO0001 Reserved for inherently not codable concepts without codable children: Secondary | ICD-10-CM | POA: Insufficient documentation

## 2013-11-08 DIAGNOSIS — M25669 Stiffness of unspecified knee, not elsewhere classified: Secondary | ICD-10-CM | POA: Insufficient documentation

## 2013-11-08 DIAGNOSIS — R262 Difficulty in walking, not elsewhere classified: Secondary | ICD-10-CM | POA: Insufficient documentation

## 2013-11-08 DIAGNOSIS — I10 Essential (primary) hypertension: Secondary | ICD-10-CM | POA: Insufficient documentation
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
## 2013-11-10 ENCOUNTER — Ambulatory Visit (HOSPITAL_COMMUNITY)
Admission: RE | Admit: 2013-11-10 | Discharge: 2013-11-10 | Disposition: A | Discharge status: Home / Self Care | Payer: BC Managed Care – PPO | Source: Ambulatory Visit | Attending: Family Medicine | Admitting: Family Medicine

## 2013-11-10 NOTE — Progress Notes (Signed)
Physical Therapy Treatment Patient Details  Name: Eileen Nunez MRN: [id number] Date of Birth: June 07, 1981  Today's Date: 11/10/2013 Time: 4401-02721348-1430 PT Time Calculation (min): 42 min Charge:  TE [id number], Manual 1420-1430  Visit#: 2 of 24  Re-eval: 12/08/13 Assessment Diagnosis: Difficuty walking s/p ACL reconstruction  Surgical Date: 10/24/13 Next MD Visit: Orie FishermanMurphey 11/30/2013  Authorization: BC[employer]  Authorization Time Period:    Authorization Visit#:   of     Subjective: Symptoms/Limitations Symptoms: Pain scale 4-5/10 increase with WB Pain Assessment Currently in Pain?: Yes Pain Score: 5  Pain Location: Knee Pain Orientation: Right;Anterior  Precautions/Restrictions  Precautions Precautions: Other (comment) Precaution Comments: ACL protocol: 1-3 weeks (10/15/2013): squat 0-40 degrees, leg press, heel/toe raises, AROM 30-100 degrees;  3-4 weeks: 4" step downs, wall sits, sport cord, AROM 0-130; 6-12 weeks AROM WNL rebounder balancing SLS, lunges at 7 weeks, isometric wall sits 90degrees at week 6, agiility exercises (fast feet, front and side controlled hopping Bil LE; Week 12-16 Full strength once quad 70% on the involvted LE begin TM running and plyometric (jump rope, figure 8, shuttle); 16 weeks 70% strength correlation must be acheived with 80% agility correlation  Exercise/Treatments Stretches Active Hamstring Stretch: 3 reps;20 seconds;Limitations Active Hamstring Stretch Limitations: on 14 in box Knee: Self-Stretch to increase Flexion: 3 reps;20 seconds;Limitations Knee: Self-Stretch Limitations: on 14 in box Gastroc Stretch: 3 reps;30 seconds;Limitations Gastroc Stretch Limitations: slant board Standing Gait Training: Gait training to improve stance phase and stride length Other Standing Knee Exercises: 3D hip excursion 10 reps (squats to 40 degrees) Supine Quad Sets: AROM;15 reps;Right Short Arc Quad Sets: AROM;10 reps Terminal Knee Extension: Right;5  reps Straight Leg Raises: 10 reps;AROM;Strengthening;Right Sidelying Hip ABduction: 10 reps;AROM Prone  Hamstring Curl: 10 reps Straight Leg Raises: AROM;Strengthening;Right;10 reps      Physical Therapy Assessment and Plan PT Assessment and Plan Clinical Impression Statement: Began POC per PT for strengthening following ACL surgery on 10/24/2013.  Added gentle standing stretches to improve flexibilty with gastroc, hamstrings and hip flexors.  Pt able to complete all therex exercises without difficutly following tactile cueing to improve quad contractions and relax gluteal mm with quad sets.  Gait training to improve Rt LE stance phase  for more normalized gait mechanics.  Manual technqiues complete to reduce edema. Pt plans to apply ice to knee for pain and edema control at home.   PT Plan: Next session per MD ACL protocol bein:  squats 0-40 degrees, heel and toe raises, forward and lateral step-ups, toe taps, standing TKE, biicycle and continue stretches for week 1-3.      Goals Home Exercise Program Pt/caregiver will Perform Home Exercise Program: For increased ROM;For increased strengthening PT Short Term Goals Time to Complete Short Term Goals: 4 weeks PT Short Term Goal 1: Patient will be able to fully extend knee to Normalize gait PT Short Term Goal 1 - Progress: Progressing toward goal PT Short Term Goal 2: Patient will demosntrated Rt quadraceps strength of 4/5 to be able to perform sit to stand without UE support PT Short Term Goal 2 - Progress: Progressing toward goal PT Short Term Goal 3: Patient will be able to flex knee to greater than 110 degrees to be able to perform stand t sit withotu UE support.  PT Short Term Goal 3 - Progress: Progressing toward goal PT Short Term Goal 4: Patient will be able to tolerate standing for >7130minutes with pain <2/10 so patient can comfortably cook meals.  PT Short Term Goal  5: Patient will be able to tolerate  walking >5330minutes with pain  <2/10 so patient can buy groceries. PT Long Term Goals Time to Complete Long Term Goals: 12 weeks PT Long Term Goal 1: Patient will display full knee ROM (0-125 or greater) to fully normalize gait PT Long Term Goal 2: Patient will display knee extension strength of 5/5 so be able to perform stair ambulation Long Term Goal 3: Patient will display knee flexion strength of 5/5 in preparation for jumping, and cutting exercises for patient to return to sport Long Term Goal 4: patient will be able to perform Single leg squat to depth of >100 degrees of knee flexion and single sleg squat endurance test to 18" bog >15x in one minute indicatign patient is ready for return to sport testing PT Long Term Goal 5: Patient Will be able to perform 3x single leg hop within 5% of uninvolved side to demsontrate equal strenght and power bilaterally and patient is ready to return to sport.   Problem List Patient Active Problem List   Diagnosis Date Noted  . Stiffness of joint, not elsewhere classified, lower leg 11/08/2013  . Difficulty in walking(719.7) 11/08/2013  . Pain in joint, lower leg 11/08/2013  . Menorrhagia 09/07/2013  . Hypertension 10/13/2012  . Depression with anxiety 10/13/2012    PT - End of Session Equipment Utilized During Treatment: Gait belt Activity Tolerance: Patient tolerated treatment well General Behavior During Therapy: [facility] assessed/performed  GP    Juel Burrowasey Jo Cockerham 11/10/2013, 3:05 PM

## 2013-11-15 ENCOUNTER — Ambulatory Visit (HOSPITAL_COMMUNITY): Payer: BC Managed Care – PPO | Admitting: Physical Therapy

## 2013-11-17 ENCOUNTER — Ambulatory Visit (HOSPITAL_COMMUNITY)
Admission: RE | Admit: 2013-11-17 | Discharge: 2013-11-17 | Disposition: A | Discharge status: Home / Self Care | Payer: BC Managed Care – PPO | Source: Ambulatory Visit | Attending: Orthopedic Surgery | Admitting: Orthopedic Surgery

## 2013-11-17 NOTE — Progress Notes (Signed)
Physical Therapy Treatment Patient Details  Name: Eileen Nunez MRN: [id number] Date of Birth: 1981/05/12  Today's Date: 11/17/2013 Time: 1012-1110 PT Time Calculation (min): 58 min Visit#: 3 of 24  Re-eval: 12/08/13 Authorization: [employer]BS  Charges:  therex 1012-1048 (36'), manual 1049-1057 (8'), icepack 1100-1110 (10')  Subjective: Symptoms/Limitations Symptoms: Pt states she really has no pain, only stiffness and continued swelling Rt knee. Pain Assessment Currently in Pain?: No/denies   Exercise/Treatments Stretches Active Hamstring Stretch: 5 reps;20 seconds Active Hamstring Stretch Limitations: on 14 in box Knee: Self-Stretch to increase Flexion: Limitations;5 reps;30 seconds Knee: Self-Stretch Limitations: on 14 in box Gastroc Stretch: 3 reps;30 seconds;Limitations Gastroc Stretch Limitations: slant board Aerobic Stationary Bike: 8 minutes seat 12 full revs Standing Heel Raises: 10 reps;Limitations Heel Raises Limitations: toeraises 10 reps Knee Flexion: 10 reps;Limitations Knee Flexion Limitations: TKF with 6" step Terminal Knee Extension: 10 reps;Theraband Theraband Level (Terminal Knee Extension): Level 4 (Blue) Lateral Step Up: Right;10 reps;Step Height: 4";Hand Hold: 1 Forward Step Up: Right;10 reps;Step Height: 4";Hand Hold: 1 Supine Short Arc Quad Sets: AROM;10 reps Straight Leg Raises: 15 reps Sidelying Hip ABduction: 15 reps;AROM Prone  Hip Extension: 10 reps Other Prone Exercises: quad set 10 res   Modalities Modalities: Cryotherapy Manual Therapy Manual Therapy: Massage Massage: scar massage, myofascial to medial knee and retro massage with elevation followed by icepack Cryotherapy Number Minutes Cryotherapy: 10 Minutes Cryotherapy Location: Knee Type of Cryotherapy: Ice pack  Physical Therapy Assessment and Plan PT Assessment and Plan Clinical Impression Statement: Progressed exercises per  ACL protocol (surgery on 10/24/2013). Pt is now 3  weeks post-op.  Began standing heel/toeraises, TKE with theraband, lateral and fwd step ups.  Pt able to complete all exercises without c/o pain.  Noted weakness with eccentric phase of step ups.  No longer needs assistance from therapist to complete prone hip extension due to increased strength.  Manual technqiues complete to reduce adhesions and edema as well as icepack at end of session.  Encouraged patient to perform manual techniques and ice at home.   Plan: Progress per ACL protocol for week 1-3.       Problem List Patient Active Problem List   Diagnosis Date Noted  . Stiffness of joint, not elsewhere classified, lower leg 11/08/2013  . Difficulty in walking(719.7) 11/08/2013  . Pain in joint, lower leg 11/08/2013  . Menorrhagia 09/07/2013  . Hypertension 10/13/2012  . Depression with anxiety 10/13/2012    PT - End of Session Equipment Utilized During Treatment: Gait belt Activity Tolerance: Patient tolerated treatment well General Behavior During Therapy: [facility] for tasks assessed/performed   Lurena Nida, PTA/CLT 11/17/2013, 11:10 AM

## 2013-11-22 ENCOUNTER — Ambulatory Visit (HOSPITAL_COMMUNITY)
Admission: RE | Admit: 2013-11-22 | Discharge: 2013-11-22 | Disposition: A | Discharge status: Home / Self Care | Payer: BC Managed Care – PPO | Source: Ambulatory Visit | Attending: Family Medicine | Admitting: Family Medicine

## 2013-11-22 DIAGNOSIS — R262 Difficulty in walking, not elsewhere classified: Secondary | ICD-10-CM | POA: Diagnosis not present

## 2013-11-22 DIAGNOSIS — M25669 Stiffness of unspecified knee, not elsewhere classified: Secondary | ICD-10-CM | POA: Insufficient documentation

## 2013-11-22 DIAGNOSIS — M25569 Pain in unspecified knee: Secondary | ICD-10-CM | POA: Insufficient documentation

## 2013-11-22 DIAGNOSIS — M6281 Muscle weakness (generalized): Secondary | ICD-10-CM | POA: Diagnosis not present

## 2013-11-22 DIAGNOSIS — IMO0001 Reserved for inherently not codable concepts without codable children: Secondary | ICD-10-CM | POA: Diagnosis not present

## 2013-11-22 DIAGNOSIS — I10 Essential (primary) hypertension: Secondary | ICD-10-CM | POA: Diagnosis not present

## 2013-11-22 DIAGNOSIS — Z9889 Other specified postprocedural states: Secondary | ICD-10-CM | POA: Diagnosis present

## 2013-11-22 NOTE — Progress Notes (Signed)
Physical Therapy Treatment Patient Details  Name: Eileen Nunez MRN: [id number] Date of Birth: 25-Nov-1980  Today's Date: 11/22/2013 Time: 0805-0856 PT Time Calculation (min): 51 min Charge: TE 8115-7262, Manual [phone], Ice [phone]   Visit#: 4 of 24  Re-eval: 12/08/13 Assessment Diagnosis: Difficuty walking s/p ACL reconstruction  Surgical Date: 10/24/13 Next MD Visit: Orie Fisherman 11/30/2013  Authorization: BCBS  Authorization Time Period:    Authorization Visit#:   of     Subjective: Symptoms/Limitations Symptoms: Returned to work with slight increase in pain, mainly stiffness.  Pain Assessment Currently in Pain?: Yes Pain Score: 1  Pain Location: Knee Pain Orientation: Right  Precautions/Restrictions  Precautions Precautions: Other (comment) Precaution Comments: ACL protocol: 1-3 weeks (10/15/2013): squat 0-40 degrees, leg press, heel/toe raises, AROM 30-100 degrees;  3-4 weeks: 4" step downs, wall sits, sport cord, AROM 0-130; 6-12 weeks AROM WNL rebounder balancing SLS, lunges at 7 weeks, isometric wall sits 90degrees at week 6, agiility exercises (fast feet, front and side controlled hopping Bil LE; Week 12-16 Full strength once quad 70% on the involvted LE begin TM running and plyometric (jump rope, figure 8, shuttle); 16 weeks 70% strength correlation must be acheived with 80% agility correlation  Exercise/Treatments Stretches Gastroc Stretch: 3 reps;30 seconds;Limitations Gastroc Stretch Limitations: slant board Aerobic Stationary Bike: 8 minutes seat 9 full revs Standing Heel Raises: 15 reps;Limitations Heel Raises Limitations: toeraises 15 reps Terminal Knee Extension: 15 reps;Theraband Theraband Level (Terminal Knee Extension): Level 4 (Blue) Lateral Step Up: Right;15 reps;Hand Hold: 1;Step Height: 4" Forward Step Up: Right;15 reps;Hand Hold: 1;Step Height: 4" Functional Squat: 10 reps;Other (comment) (0-40degrees) Supine Quad Sets: AROM;15 reps;Right Short  Arc Quad Sets: AROM;15 reps Prone  Hamstring Curl: 15 reps Hip Extension: 15 reps   Modalities Modalities: Cryotherapy Manual Therapy Manual Therapy: Edema management Edema Management: Retro massage with LE elevated Cryotherapy Number Minutes Cryotherapy: 10 Minutes Cryotherapy Location: Knee Type of Cryotherapy: Ice pack  Physical Therapy Assessment and Plan PT Assessment and Plan Clinical Impression Statement: Week 4 post-op (10/24/2013)  Progressed to step down with visual quad fatigue from weak eccentric control.  Pt able to complete all exercises correctly following demonstration and verbal cueing for technique.  Manual techniques complete for edema control and ice applied end of sessoin for pain and edema control. PT Plan: Progress per ACL protocol for week 4.  Continue stair training, quad strengtheing for knee extension.  May begin wall squats (45-60 degrees), sports cord and mediam speed hamstring isokinetic on [employer].    Goals Home Exercise Program Pt/caregiver will Perform Home Exercise Program: For increased ROM;For increased strengthening PT Short Term Goals Time to Complete Short Term Goals: 4 weeks PT Short Term Goal 1: Patient will be able to fully extend knee to Normalize gait PT Short Term Goal 1 - Progress: Progressing toward goal PT Short Term Goal 2: Patient will demosntrated Rt quadraceps strength of 4/5 to be able to perform sit to stand without UE support PT Short Term Goal 2 - Progress: Progressing toward goal PT Short Term Goal 3: Patient will be able to flex knee to greater than 110 degrees to be able to perform stand t sit withotu UE support.  PT Short Term Goal 3 - Progress: Progressing toward goal PT Short Term Goal 4: Patient will be able to tolerate standing for >70minutes with pain <2/10 so patient can comfortably cook meals.  PT Short Term Goal 5: Patient will be able to tolerate  walking >1minutes with pain <2/10 so patient  can buy groceries. PT  Long Term Goals Time to Complete Long Term Goals: 12 weeks PT Long Term Goal 1: Patient will display full knee ROM (0-125 or greater) to fully normalize gait PT Long Term Goal 2: Patient will display knee extension strength of 5/5 so be able to perform stair ambulation Long Term Goal 3: Patient will display knee flexion strength of 5/5 in preparation for jumping, and cutting exercises for patient to return to sport Long Term Goal 4: patient will be able to perform Single leg squat to depth of >100 degrees of knee flexion and single sleg squat endurance test to 18" bog >15x in one minute indicatign patient is ready for return to sport testing PT Long Term Goal 5: Patient Will be able to perform 3x single leg hop within 5% of uninvolved side to demsontrate equal strenght and power bilaterally and patient is ready to return to sport.   Problem List Patient Active Problem List   Diagnosis Date Noted  . Stiffness of joint, not elsewhere classified, lower leg 11/08/2013  . Difficulty in walking(719.7) 11/08/2013  . Pain in joint, lower leg 11/08/2013  . Menorrhagia 09/07/2013  . Hypertension 10/13/2012  . Depression with anxiety 10/13/2012    PT - End of Session Activity Tolerance: Patient tolerated treatment well General Behavior During Therapy: [facility] tasks assessed/performed  GP    Juel Burrowasey Jo [place] Nunley 11/22/2013, 11:33 AM

## 2013-11-24 ENCOUNTER — Ambulatory Visit (HOSPITAL_COMMUNITY)
Admission: RE | Admit: 2013-11-24 | Discharge: 2013-11-24 | Disposition: A | Discharge status: Home / Self Care | Payer: BC Managed Care – PPO | Source: Ambulatory Visit | Attending: Family Medicine | Admitting: Family Medicine

## 2013-11-24 DIAGNOSIS — IMO0001 Reserved for inherently not codable concepts without codable children: Secondary | ICD-10-CM | POA: Diagnosis not present

## 2013-11-24 NOTE — Progress Notes (Signed)
Physical Therapy Treatment Patient Details  Name: Eileen Nunez MRN: [id number] Date of Birth: [birth year]  Today's Date: 11/24/2013 Time: 0805-0910 PT Time Calculation (min): 65 min  Visit#: 5 of 24  Re-eval: 12/08/13 Authorization: BCBS  Charges:  therex [phone] (40'), manual [phone] (10'), ice [phone] (10')   Subjective: Symptoms/Limitations Symptoms: Pt reports compliance with HEP and is getting into her pool at home.  Pt comes today without brace on.  suggested patient make sure this is OK with MD. Pain Assessment Currently in Pain?: No/denies   Exercise/Treatments Stretches Active Hamstring Stretch: 20 seconds;3 reps Active Hamstring Stretch Limitations: on 14 in box Knee: Self-Stretch to increase Flexion: Limitations;30 seconds;3 reps Knee: Self-Stretch Limitations: on 14 in box Gastroc Stretch: 3 reps;30 seconds;Limitations Gastroc Stretch Limitations: slant board Aerobic Stationary Bike: 8 minutes seat 9 full revs Isokinetic: 10 reps each 180-150-120 Plyometrics Other Plyometric Exercises: sports cord small 1 Rt each way on carpet Standing Heel Raises: 15 reps;Limitations Heel Raises Limitations: toeraises 15 reps Terminal Knee Extension: 15 reps;Theraband Theraband Level (Terminal Knee Extension): Level 4 (Blue) Lateral Step Up: Right;15 reps;Hand Hold: 1;Step Height: 4" Forward Step Up: Right;15 reps;Hand Hold: 1;Step Height: 4" Step Down: Right;10 reps;Step Height: 4" Wall Squat: 5 sets;20 reps   Modalities Modalities: Cryotherapy Manual Therapy Manual Therapy: Edema management Edema Management: Retro massage Rt LE with LE elevated Cryotherapy Number Minutes Cryotherapy: 10 Minutes Cryotherapy Location: Knee;Upper arm Type of Cryotherapy: Ice pack  Physical Therapy Assessment and Plan PT Assessment and Plan Clinical Impression Statement: Week 4 post-op (11-24-13).  Progressed to forward step downs with visual eccentric weakness.  Added bungee  resistance walking all 3 planes, isokinetic work and wall sit activity.  Pt able to complete all activities with little difficulty.  continued with manual techniques to reduce swelling followed by icepack at end of session. PT Plan: Progress per ACL protocol for week 4.  Continue stair training, quad strengtheing for knee extension. Progress to 30 second wall sits and increased speed with sports cord.     Problem List Patient Active Problem List   Diagnosis Date Noted  . Stiffness of joint, not elsewhere classified, lower leg 11/08/2013  . Difficulty in walking(719.7) 11/08/2013  . Pain in joint, lower leg 11/08/2013  . Menorrhagia 09/07/2013  . Hypertension 10/13/2012  . Depression with anxiety 10/13/2012    PT - End of Session Activity Tolerance: Patient tolerated treatment well General Behavior During Therapy: [facility] for tasks assessed/performed  GP    Lurena Nida, PTA/CLT 11/24/2013, 9:34 AM

## 2013-11-29 ENCOUNTER — Ambulatory Visit (HOSPITAL_COMMUNITY)
Admission: RE | Admit: 2013-11-29 | Discharge: 2013-11-29 | Disposition: A | Discharge status: Home / Self Care | Payer: BC Managed Care – PPO | Source: Ambulatory Visit | Attending: Family Medicine | Admitting: Family Medicine

## 2013-11-29 DIAGNOSIS — IMO0001 Reserved for inherently not codable concepts without codable children: Secondary | ICD-10-CM | POA: Diagnosis not present

## 2013-11-29 NOTE — Progress Notes (Signed)
Physical Therapy Treatment Patient Details  Name: Eileen Nunez MRN: [id number] Date of Birth: March 01, 1981  Today's Date: 11/29/2013 Time: 0802-0904 PT Time Calculation (min): 47 min Charge: TE [id number], Manual 0840-0850, ROM Measurement [phone], Ice [phone]  Visit#: 6 of 24  Re-eval: 12/08/13 Assessment Diagnosis: Difficuty walking s/p ACL reconstruction  Surgical Date: 10/24/13 Next MD Visit: Maretta Los 11/30/2013  Authorization: BC[employer]  Authorization Time Period:    Authorization Visit#:   of     Subjective: Symptoms/Limitations Symptoms: Pt stated biggest problem with knee is the full extension and swelling, pain scale 3/10 this morning.   How long can you sit comfortably?: unlimited How long can you stand comfortably?: 30 minutes (was <27mnutes) How long can you walk comfortably?: 2 1/2 hours (was <572mutes) Pain Assessment Currently in Pain?: Yes Pain Score: 3  Pain Location: Knee Pain Orientation: Right  Precautions/Restrictions  Precautions Precautions: Other (comment) Precaution Comments: ACL protocol: 1-3 weeks (10/15/2013): squat 0-40 degrees, leg press, heel/toe raises, AROM 30-100 degrees;  3-4 weeks: 4" step downs, wall sits, sport cord, AROM 0-130; 6-12 weeks AROM WNL rebounder balancing SLS, lunges at 7 weeks, isometric wall sits 90degrees at week 6, agiility exercises (fast feet, front and side controlled hopping Bil LE; Week 12-16 Full strength once quad 70% on the involvted LE begin TM running and plyometric (jump rope, figure 8, shuttle); 16 weeks 70% strength correlation must be acheived with 80% agility correlation  Objective;  11/29/13 1200  Assessment  Diagnosis Difficuty walking s/p ACL reconstruction   Surgical Date 10/24/13  Next MD Visit MuMaretta Los6/03/2014  Precautions  Precautions Other (comment)  Precaution Comments ACL protocol: 1-3 weeks (10/15/2013): squat 0-40 degrees, leg press, heel/toe raises, AROM 30-100 degrees;  3-4 weeks: 4" step  downs, wall sits, sport cord, AROM 0-130; 6-12 weeks AROM WNL rebounder balancing SLS, lunges at 7 weeks, isometric wall sits 90degrees at week 6, agiility exercises (fast feet, front and side controlled hopping Bil LE; Week 12-16 Full strength once quad 70% on the involvted LE begin TM running and plyometric (jump rope, figure 8, shuttle); 16 weeks 70% strength correlation must be acheived with 80% agility correlation  RLE AROM (degrees)  Right Knee Extension -3 (was -8)  Right Knee Flexion 120 (was 95)    Exercise/Treatments Stretches Gastroc Stretch: 3 reps;30 seconds;Limitations Gastroc Stretch Limitations: slant board Aerobic Stationary Bike: 8 minutes seat 9 full revs Isokinetic: 10 reps each 180-150-120 Plyometrics Other Plyometric Exercises: sports cord small 1 Rt each way on carpet Standing Heel Raises: 15 reps;Limitations Heel Raises Limitations: toeraises 15 reps Terminal Knee Extension: 15 reps;Theraband Theraband Level (Terminal Knee Extension): Level 4 (Blue) Lateral Step Up: Right;15 reps;Hand Hold: 1;Step Height: 4" Forward Step Up: Right;15 reps;Hand Hold: 1;Step Height: 4" Step Down: Right;15 reps;Hand Hold: 1;Step Height: 4" Functional Squat: 15 reps Wall Squat: 5 sets;20 reps Supine Quad Sets: AROM;15 reps;Right Heel Slides: 10 reps   Modalities Modalities: Cryotherapy Manual Therapy Manual Therapy: Edema management Edema Management: Retro massage Rt LE with LE elevated Cryotherapy Number Minutes Cryotherapy: 10 Minutes Cryotherapy Location: Knee Type of Cryotherapy: Ice pack  Physical Therapy Assessment and Plan PT Assessment and Plan Clinical Impression Statement: Week 5 post-op date 10/24/2013.  Continued with current ACL protocol exercises with minimal difficulty following demonstration and vc-ing for technique.  Able to increase height with step up without diffiiculty, continued with 4in step down and lateral step up with visible musculature fatigue  with 15 reps.  Reviewed goals and ROM  taken prior  MD apt tomorrow.  Pt with improved  tolerance for sitting, standing and walking.  AROM 3-120 degrees.  Manual techniques complete for edema and ended session with ice for pain control.   PT Plan: Progress per ACL protocol for week 4.  Continue stair training, quad strengtheing for knee extension. Progress to 30 second wall sits and increased speed with sports cord.    Goals Home Exercise Program Pt/caregiver will Perform Home Exercise Program: For increased ROM;For increased strengthening PT Short Term Goals Time to Complete Short Term Goals: 4 weeks PT Short Term Goal 1: Patient will be able to fully extend knee to Normalize gait PT Short Term Goal 1 - Progress: Progressing toward goal PT Short Term Goal 2: Patient will demosntrated Rt quadraceps strength of 4/5 to be able to perform sit to stand without UE support PT Short Term Goal 2 - Progress: Progressing toward goal PT Short Term Goal 3: Patient will be able to flex knee to greater than 110 degrees to be able to perform stand t sit withotu UE support.  PT Short Term Goal 3 - Progress: Met (AROM -3-120) PT Short Term Goal 4: Patient will be able to tolerate standing for >3mnutes with pain <2/10 so patient can comfortably cook meals.  PT Short Term Goal 4 - Progress: Met PT Short Term Goal 5: Patient will be able to tolerate  walking >356mutes with pain <2/10 so patient can buy groceries. PT Short Term Goal 5 - Progress: Met PT Long Term Goals Time to Complete Long Term Goals: 12 weeks PT Long Term Goal 1: Patient will display full knee ROM (0-125 or greater) to fully normalize gait PT Long Term Goal 1 - Progress: Progressing toward goal PT Long Term Goal 2: Patient will display knee extension strength of 5/5 so be able to perform stair ambulation PT Long Term Goal 2 - Progress: Progressing toward goal Long Term Goal 3: Patient will display knee flexion strength of 5/5 in preparation  for jumping, and cutting exercises for patient to return to sport Long Term Goal 3 Progress: Other (comment) (not addressed, week 5 post-op) Long Term Goal 4: patient will be able to perform Single leg squat to depth of >100 degrees of knee flexion and single sleg squat endurance test to 18" bog >15x in one minute indicatign patient is ready for return to sport testing Long Term Goal 4 Progress: Other (comment) (not address, week 5 post op) PT Long Term Goal 5: Patient Will be able to perform 3x single leg hop within 5% of uninvolved side to demsontrate equal strenght and power bilaterally and patient is ready to return to sport.  Long Term Goal 5 Progress: Other (comment) (No addressed, week 5 post-op)  Problem List Patient Active Problem List   Diagnosis Date Noted  . Stiffness of joint, not elsewhere classified, lower leg 11/08/2013  . Difficulty in walking(719.7) 11/08/2013  . Pain in joint, lower leg 11/08/2013  . Menorrhagia 09/07/2013  . Hypertension 10/13/2012  . Depression with anxiety 10/13/2012    PT - End of Session Equipment Utilized During Treatment: Gait belt Activity Tolerance: Patient tolerated treatment well General Behavior During Therapy: [facility]  GP    CaAldona Lento/02/2014, 12:56 PM

## 2013-12-01 ENCOUNTER — Ambulatory Visit (HOSPITAL_COMMUNITY)
Admission: RE | Admit: 2013-12-01 | Discharge: 2013-12-01 | Disposition: A | Discharge status: Home / Self Care | Payer: BC Managed Care – PPO | Source: Ambulatory Visit | Attending: Family Medicine | Admitting: Family Medicine

## 2013-12-01 DIAGNOSIS — IMO0001 Reserved for inherently not codable concepts without codable children: Secondary | ICD-10-CM | POA: Diagnosis not present
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
## 2013-12-06 ENCOUNTER — Ambulatory Visit (HOSPITAL_COMMUNITY)
Admission: RE | Admit: 2013-12-06 | Discharge: 2013-12-06 | Disposition: A | Discharge status: Home / Self Care | Payer: BC Managed Care – PPO | Source: Ambulatory Visit | Attending: Family Medicine | Admitting: Family Medicine

## 2013-12-06 DIAGNOSIS — IMO0001 Reserved for inherently not codable concepts without codable children: Secondary | ICD-10-CM | POA: Diagnosis not present

## 2013-12-06 NOTE — Progress Notes (Signed)
Physical Therapy Treatment Patient Details  Name: Eileen Nunez MRN: [id number] Date of Birth: [birth year]  Today's Date: 12/06/2013 Time: 0802-0858 PT Time Calculation (min): 56 min Charge: TE [id number], Manual [phone], Ice [phone]  Visit#: 8 of 24  Re-eval: 12/08/13    Authorization: BCBS  Authorization Time Period:    Authorization Visit#:   of     Subjective: Symptoms/Limitations Symptoms: Pain free today, reports no brace since last Thursday per MD to wean off.  Reports stiffness reducing, stil having difficulty with full extension. Pain Assessment Currently in Pain?: No/denies  Precautions/Restrictions  Precautions Precautions: Other (comment) Precaution Comments: ACL protocol: 1-3 weeks (10/15/2013): squat 0-40 degrees, leg press, heel/toe raises, AROM 30-100 degrees;  3-4 weeks: 4" step downs, wall sits, sport cord, AROM 0-130; 6-12 weeks AROM WNL rebounder balancing SLS, lunges at 7 weeks, isometric wall sits 90degrees at week 6, agiility exercises (fast feet, front and side controlled hopping Bil LE; Week 12-16 Full strength once quad 70% on the involvted LE begin TM running and plyometric (jump rope, figure 8, shuttle); 16 weeks 70% strength correlation must be acheived with 80% agility correlation  Exercise/Treatments Stretches Active Hamstring Stretch: 20 seconds;3 reps Active Hamstring Stretch Limitations: on 14 in box Gastroc Stretch: 3 reps;30 seconds;Limitations Gastroc Stretch Limitations: slant board  Aerobic Stationary Bike: 8 minutes seat 9 full revs Plyometrics Other Plyometric Exercises: sports cord small 1 Rt each way on carpet Standing Forward Lunges: Limitations Forward Lunges Limitations: 3way knee driver to 8" box [id number] Side Lunges: Limitations Side Lunges Limitations: to 12" box 10x.  Lateral Step Up: Right;20 reps;Hand Hold: 0;Step Height: 6" Forward Step Up: Right;20 reps;Hand Hold: 0;Step Height: 6" Step Down: Right;20 reps;Hand Hold:  0;Step Height: 6" Wall Squat: 3 sets;Limitations Wall Squat Limitations: 30" holds Other Standing Knee Exercises: Retro hamstring stretch walk  Modalities Modalities: Cryotherapy Manual Therapy Massage: scar massage, MFR to Rectus femoris and vastus lateralis Cryotherapy Number Minutes Cryotherapy: 10 Minutes Cryotherapy Location: Knee Type of Cryotherapy: Ice pack  Physical Therapy Assessment and Plan PT Assessment and Plan Clinical Impression Statement: Pt progressing well towards goals, able to increase hold time wtih wall squats to progress stability.  Pt displays improved AROM and strength with increased ease with stair training with increased height.  Ended session with manual techniques to reduce fascial restrictions quadriceps region.   PT Plan: Progress per ACL protocol for week 6.  Continue stair training, quad strengthening for knee extension. Progress to thick sports cord. Continue to progress strength an stability of Rt knee via multidirection exercises    Goals PT Short Term Goals PT Short Term Goal 1: Patient will be able to fully extend knee to Normalize gait PT Short Term Goal 1 - Progress: Progressing toward goal PT Short Term Goal 2: Patient will demosntrated Rt quadraceps strength of 4/5 to be able to perform sit to stand without UE support PT Short Term Goal 2 - Progress: Progressing toward goal PT Short Term Goal 3: Patient will be able to flex knee to greater than 110 degrees to be able to perform stand t sit withotu UE support.  PT Short Term Goal 4: Patient will be able to tolerate standing for >1930minutes with pain <2/10 so patient can comfortably cook meals.  PT Long Term Goals PT Long Term Goal 2: Patient will display knee extension strength of 5/5 so be able to perform stair ambulation PT Long Term Goal 2 - Progress: Progressing toward goal Long Term Goal 3: Patient will display  knee flexion strength of 5/5 in preparation for jumping, and cutting exercises  for patient to return to sport Long Term Goal 4: patient will be able to perform Single leg squat to depth of >100 degrees of knee flexion and single sleg squat endurance test to 18" bog >15x in one minute indicatign patient is ready for return to sport testing PT Long Term Goal 5: Patient Will be able to perform 3x single leg hop within 5% of uninvolved side to demsontrate equal strenght and power bilaterally and patient is ready to return to sport.   Problem List Patient Active Problem List   Diagnosis Date Noted  . Stiffness of joint, not elsewhere classified, lower leg 11/08/2013  . Difficulty in walking(719.7) 11/08/2013  . Pain in joint, lower leg 11/08/2013  . Menorrhagia 09/07/2013  . Hypertension 10/13/2012  . Depression with anxiety 10/13/2012    PT - End of Session Activity Tolerance: Patient tolerated treatment well General Behavior During Therapy: [facility] for tasks assessed/performed  GP    Juel BurrowCockerham, Casey Jo 12/06/2013, 12:28 PM

## 2013-12-08 ENCOUNTER — Ambulatory Visit (HOSPITAL_COMMUNITY)
Admission: RE | Admit: 2013-12-08 | Discharge: 2013-12-08 | Disposition: A | Discharge status: Home / Self Care | Payer: BC Managed Care – PPO | Source: Ambulatory Visit | Attending: Family Medicine | Admitting: Family Medicine

## 2013-12-08 DIAGNOSIS — IMO0001 Reserved for inherently not codable concepts without codable children: Secondary | ICD-10-CM | POA: Diagnosis not present

## 2013-12-08 NOTE — Progress Notes (Signed)
Physical Therapy Treatment Patient Details  Name: Eileen Nunez MRN: [id number] Date of Birth: 1980/07/22  Today's Date: 12/08/2013 Time: 0805-0855 PT Time Calculation (min): 50 min Charge: TE [id number], Manual [id number], ICe [id number]   Visit#: 9 of 24  Re-eval: 12/08/13 Assessment Diagnosis: Difficuty walking s/p ACL reconstruction  Surgical Date: 10/24/13 Next MD Visit: Orie FishermanMurphey 12/19/2013  Authorization: BCBS  Authorization Time Period:    Authorization Visit#:   of     Subjective: Symptoms/Limitations Symptoms: Pt stated increased swelling following last session, current pain scale 2/10 Pain Assessment Currently in Pain?: Yes Pain Score: 2  Pain Location: Knee Pain Orientation: Right  Precautions/Restrictions  Precautions Precautions: Other (comment) Precaution Comments: ACL protocol: 1-3 weeks (10/15/2013): squat 0-40 degrees, leg press, heel/toe raises, AROM 30-100 degrees;  3-4 weeks: 4" step downs, wall sits, sport cord, AROM 0-130; 6-12 weeks AROM WNL rebounder balancing SLS, lunges at 7 weeks, isometric wall sits 90degrees at week 6, agiility exercises (fast feet, front and side controlled hopping Bil LE; Week 12-16 Full strength once quad 70% on the involvted LE begin TM running and plyometric (jump rope, figure 8, shuttle); 16 weeks 70% strength correlation must be acheived with 80% agility correlation  Exercise/Treatments Stretches Gastroc Stretch: 3 reps;30 seconds;Limitations Gastroc Stretch Limitations: slant board  Aerobic Stationary Bike: 8 minutes seat 9 full revs Isokinetic: 10 reps each 180-150-120 Plyometrics Other Plyometric Exercises: sports cord thickl 1 Rt each way on carpet Standing Lateral Step Up: Right;20 reps;Hand Hold: 0;Step Height: 6" Forward Step Up: Right;20 reps;Hand Hold: 0;Step Height: 6" Step Down: Right;20 reps;Hand Hold: 0;Step Height: 6" Wall Squat: 3 sets;Limitations Wall Squat Limitations: 30" holds 90 degrees Rebounder: Rt  SLS red ball 20reps   Modalities Modalities: Cryotherapy Manual Therapy Edema Management: Retro massage Rt LE with LE elevated Joint Mobilization: Patella mobs all directions, tib/fib joint mobs Cryotherapy Number Minutes Cryotherapy: 10 Minutes Cryotherapy Location: Knee Type of Cryotherapy: Ice pack  Physical Therapy Assessment and Plan PT Assessment and Plan Clinical Impression Statement: Pt is progressing well towards goals, added SLS rebounder per ACL protocol.  Pt able to complete 20 reps SLS with no LOB episodes.  Increased to thick sports cord without difficulty.  Pt c/o knee popping with stair training today, pt instructued patella mobs to improve patella tracking.  Manual techniques complete to reduce edema and fascial restrictions quadriceps region.  Ended session with ice for edema and pain control.  Pt stated pain scale 1/10. PT Plan: Prpgress per ACL protocol for week 7, initiate lunges and begin fitter.  Continue stair training, quad strengthening for knee extension.      Goals PT Short Term Goals PT Short Term Goal 1: Patient will be able to fully extend knee to Normalize gait PT Short Term Goal 1 - Progress: Progressing toward goal PT Short Term Goal 2: Patient will demosntrated Rt quadraceps strength of 4/5 to be able to perform sit to stand without UE support PT Short Term Goal 2 - Progress: Progressing toward goal PT Short Term Goal 3: Patient will be able to flex knee to greater than 110 degrees to be able to perform stand t sit withotu UE support.  PT Short Term Goal 4: Patient will be able to tolerate standing for >4430minutes with pain <2/10 so patient can comfortably cook meals.  PT Long Term Goals PT Long Term Goal 2: Patient will display knee extension strength of 5/5 so be able to perform stair ambulation PT Long Term Goal 2 - Progress: Progressing  toward goal Long Term Goal 3: Patient will display knee flexion strength of 5/5 in preparation for jumping, and  cutting exercises for patient to return to sport Long Term Goal 4: patient will be able to perform Single leg squat to depth of >100 degrees of knee flexion and single sleg squat endurance test to 18" bog >15x in one minute indicatign patient is ready for return to sport testing PT Long Term Goal 5: Patient Will be able to perform 3x single leg hop within 5% of uninvolved side to demsontrate equal strenght and power bilaterally and patient is ready to return to sport.   Problem List Patient Active Problem List   Diagnosis Date Noted  . Stiffness of joint, not elsewhere classified, lower leg 11/08/2013  . Difficulty in walking(719.7) 11/08/2013  . Pain in joint, lower leg 11/08/2013  . Menorrhagia 09/07/2013  . Hypertension 10/13/2012  . Depression with anxiety 10/13/2012    PT - End of Session Activity Tolerance: Patient tolerated treatment well General Behavior During Therapy: [facility] for tasks assessed/performed  GP    Juel BurrowCockerham, Casey Jo 12/08/2013, 9:07 AM

## 2013-12-13 ENCOUNTER — Ambulatory Visit (HOSPITAL_COMMUNITY)
Admission: RE | Admit: 2013-12-13 | Discharge: 2013-12-13 | Disposition: A | Discharge status: Home / Self Care | Payer: BC Managed Care – PPO | Source: Ambulatory Visit | Attending: Family Medicine | Admitting: Family Medicine

## 2013-12-13 DIAGNOSIS — IMO0001 Reserved for inherently not codable concepts without codable children: Secondary | ICD-10-CM | POA: Diagnosis not present

## 2013-12-13 NOTE — Evaluation (Addendum)
Physical Therapy Reassessment  Patient Details  Name: Eileen Nunez MRN: [id number] Date of Birth: 05-26-1981  Today's Date: 12/13/2013 Time: 0805-0845 PT Time Calculation (min): 40 min     Charges: therEx [id number], Manual [id number]         Visit#: 10 of 24  Re-eval: 01/12/14 Assessment Diagnosis: Difficuty walking s/p ACL reconstruction  Surgical Date: 10/24/13 Next MD Visit: Maretta Los 12/19/2013  Authorization: BCBS    Authorization Time Period:    Authorization Visit#: 46 of 24   Past Medical History:  Past Medical History  Diagnosis Date  . Hypertension     during pregnancy  . Diabetes mellitus without complication     gestational diabetes  . Menorrhagia 09/07/2013    Has period about every 3 weeks and has cramp too  . Anxiety    Past Surgical History:  Past Surgical History  Procedure Laterality Date  . Cesarean section      x 2  . Tubal ligation    . Dilitation & currettage/hystroscopy with thermachoice ablation N/A 09/20/2013    Procedure: DILATATION & CURETTAGE/HYSTEROSCOPY WITH THERMACHOICE ABLATION;  Surgeon: Jonnie Kind, MD;  Location: [facility];  Service: Gynecology;  Laterality: N/A;  total therapy time: 9 minutes 30 seconds;  temperature:  87 degrees     Subjective Symptoms/Limitations Symptoms: Patient reports primary difficulty of bending knee. Notes pain only at end range.  Patient Stated Goals: To get back to running, and volleyball Pain Assessment Currently in Pain?: No/denies  Precautions/Restrictions  Precautions Precautions: Other (comment) Precaution Comments: ACL protocol: 1-3 weeks (10/15/2013): squat 0-40 degrees, leg press, heel/toe raises, AROM 30-100 degrees;  3-4 weeks: 4" step downs, wall sits, sport cord, AROM 0-130; 6-12 weeks AROM WNL rebounder balancing SLS, lunges at 7 weeks, isometric wall sits 90degrees at week 6, agiility exercises (fast feet, front and side controlled hopping Bil LE; Week 12-16 Full strength once quad 70% on the  involvted LE begin TM running and plyometric (jump rope, figure 8, shuttle); 16 weeks 70% strength correlation must be acheived with 80% agility correlation   Assessment RLE AROM (degrees) Right Knee Extension: 0 Right Knee Flexion: 126 RLE Strength Right Hip Flexion: 5/5 Right Hip Extension: 4/5 Right Hip ABduction:  (4+/5) Right Knee Flexion: 4/5 Right Knee Extension: 5/5 Right Ankle Dorsiflexion: 5/5  Exercise/Treatments Stretches Active Hamstring Stretch Limitations: on 14" 10x 3seconds Hip Flexor Stretch Limitations: on 14" 10x 3seconds Standing Forward Lunges Limitations: 3way knee driver to 8" box 24P Side Lunges Limitations: to 6" box 10x.  Functional Squat Limitations: sagittal frontal transverse squat (27 position) with 5lb dumbbell 2x each SLS with Vectors: Single ;leg balance reach matrix common 5x Other Standing Knee Exercises: anterior lunge walk 1 lap in gym   Manual therapy: Tibia femoral distraction and grade 2 - 3 tibia femoral mobilizations anterior and posterior for soreness  Physical Therapy Assessment and Plan PT Assessment and Plan Clinical Impression Statement: Patient has made great progress meetign all short term goals and progressing towards meetign all long term goals. Patient displays significantly improved gait but contineus to display limited knee flexion resultign in difficulty squatting to the floor to pick up thing in cabinets below her sink. Patient also conitnues to have minor pain but only at end range.. patient demosntrated a good response to introduction of multiplanr squattign and lunding with increase depth and noting no pain.  PT Plan: contineu skilled PT 2x a week for 8 more weeks to reach long term goals. Prpgress per ACL  protocol for week 7, Continue lungeslunges and begin fitter.  Continue stair training, quad strengthening for knee extension.  Progress depth of loading to improve strength.     Goals PT Short Term Goals PT Short Term  Goal 1: Patient will be able to fully extend knee to Normalize gait PT Short Term Goal 1 - Progress: Met PT Short Term Goal 2: Patient will demosntrated Rt quadraceps strength of 4/5 to be able to perform sit to stand without UE support PT Short Term Goal 2 - Progress: Met PT Short Term Goal 3: Patient will be able to flex knee to greater than 110 degrees to be able to perform stand t sit withotu UE support.  PT Short Term Goal 3 - Progress: Met PT Short Term Goal 4: Patient will be able to tolerate standing for >57minutes with pain <2/10 so patient can comfortably cook meals.  PT Short Term Goal 4 - Progress: Met PT Short Term Goal 5: Patient will be able to tolerate  walking >67minutes with pain <2/10 so patient can buy groceries. PT Short Term Goal 5 - Progress: Met PT Long Term Goals PT Long Term Goal 1: Patient will display full knee ROM (0-125 or greater) to fully normalize gait PT Long Term Goal 1 - Progress: Met PT Long Term Goal 2: Patient will display knee extension strength of 5/5 so be able to perform stair ambulation PT Long Term Goal 2 - Progress: Met Long Term Goal 3: Patient will display knee flexion strength of 5/5 in preparation for jumping, and cutting exercises for patient to return to sport Long Term Goal 3 Progress: Progressing toward goal Long Term Goal 4: patient will be able to perform Single leg squat to depth of >100 degrees of knee flexion and single sleg squat endurance test to 18" bog >15x in one minute indicatign patient is ready for return to sport testing Long Term Goal 4 Progress: Progressing toward goal PT Long Term Goal 5: Patient Will be able to perform 3x single leg hop within 5% of uninvolved side to demsontrate equal strenght and power bilaterally and patient is ready to return to sport.  Long Term Goal 5 Progress: Progressing toward goal  Problem List Patient Active Problem List   Diagnosis Date Noted  . Stiffness of joint, not elsewhere classified,  lower leg 11/08/2013  . Difficulty in walking(719.7) 11/08/2013  . Pain in joint, lower leg 11/08/2013  . Menorrhagia 09/07/2013  . Hypertension 10/13/2012  . Depression with anxiety 10/13/2012    PT - End of Session Equipment Utilized During Treatment: Gait belt Activity Tolerance: Patient tolerated treatment well General Behavior During Therapy: [facility] for tasks assessed/performed  GP    Salimata Christenson R 12/13/2013, 10:00 AM  Physician Documentation Your signature is required to indicate approval of the treatment plan as stated above.  Please sign and either send electronically or make a copy of this report for your files and return this physician signed original.   Please mark one 1.__approve of plan  2. ___approve of plan with the following conditions.   ______________________________                                                          _____________________ Physician Signature  Date  

## 2013-12-15 ENCOUNTER — Ambulatory Visit (HOSPITAL_COMMUNITY)
Admission: RE | Admit: 2013-12-15 | Discharge: 2013-12-15 | Disposition: A | Discharge status: Home / Self Care | Payer: BC Managed Care – PPO | Source: Ambulatory Visit | Attending: Family Medicine | Admitting: Family Medicine

## 2013-12-15 DIAGNOSIS — IMO0001 Reserved for inherently not codable concepts without codable children: Secondary | ICD-10-CM | POA: Diagnosis not present

## 2013-12-15 NOTE — Progress Notes (Signed)
Physical Therapy Treatment Patient Details  Name: Eileen Nunez MRN: [id number] Date of Birth: [birth year]  Today's Date: 12/15/2013 Time: [id number] PT Time Calculation (min): 41 min    Charges: therEx [id number], Manual [phone] Visit#: 11 of 24  Re-eval: 01/12/14 Assessment Diagnosis: Difficuty walking s/p ACL reconstruction  Surgical Date: 10/24/13 Next MD Visit: Orie FishermanMurphey 01/16/2014  Authorization: [employer]  Authorization Time Period:    Authorization Visit#: 11 of 24   Subjective: Symptoms/Limitations Symptoms: Patient reports soreness following last session but no increase/change in pain.  Pain Assessment Currently in Pain?: No/denies  Precautions/Restrictions  Precautions Precautions: Other (comment) Precaution Comments: ACL protocol: 1-3 weeks (10/15/2013): squat 0-40 degrees, leg press, heel/toe raises, AROM 30-100 degrees;  3-4 weeks: 4" step downs, wall sits, sport cord, AROM 0-130; 6-12 weeks AROM WNL rebounder balancing SLS, lunges at 7 weeks, isometric wall sits 90degrees at week 6, agiility exercises (fast feet, front and side controlled hopping Bil LE; Week 12-16 Full strength once quad 70% on the involvted LE begin TM running and plyometric (jump rope, figure 8, shuttle); 16 weeks 70% strength correlation must be acheived with 80% agility correlation  Exercise/Treatments Stretches Active Hamstring Stretch Limitations: on 14" 10x 3seconds Quad Stretch: Limitations Quad Stretch Limitations: 8" retro to airex half kneel rectus femoris stretch 20seconds Hip Flexor Stretch Limitations: on 14" 10x 3seconds Gastroc Stretch Limitations: at wall 3 way 5x 3sec Standing Forward Lunges Limitations: to 6" box 10x Side Lunges Limitations: to 6" box 10x.  Functional Squat Limitations: Squat reach matrix with 5lb dumbbell 5x SLS with Vectors: Single leg balance reach matrix common 5x Other Standing Knee Exercises: Fitter 20x  Other Standing Knee Exercises: Sumo walk with green Tband 2x  1 lap at knee and 1 lap at ankle height.   Manual Therapy Manual Therapy: Other (comment) Massage: scar massage, MFR to Rectus femoris and vastus lateralis Other Manual Therapy: Thomas stretch 4x 20seconds.   Physical Therapy Assessment and Plan PT Assessment and Plan Clinical Impression Statement: Session focused on continued LE strengthening to progress depth of squatting. Patient noted increased soreness at 90 degrees of squattign noting that it would hurt in her upper thigh, followign manual stretching andmassage patient noted decreased pain and displayed improved though still limited squat depth.  PT Plan:  Prpgress per ACL protocol for week 8, Continue lunges lunges prgressing depth to 4" and progress step ups to 8"., quad strengthening for knee extension.  Progress depth of loading to improve strength. continue thomas stretch    Goals PT Long Term Goals Long Term Goal 3: Patient will display knee flexion strength of 5/5 in preparation for jumping, and cutting exercises for patient to return to sport Long Term Goal 3 Progress: Progressing toward goal Long Term Goal 4: patient will be able to perform Single leg squat to depth of >100 degrees of knee flexion and single sleg squat endurance test to 18" bog >15x in one minute indicatign patient is ready for return to sport testing Long Term Goal 4 Progress: Progressing toward goal PT Long Term Goal 5: Patient Will be able to perform 3x single leg hop within 5% of uninvolved side to demsontrate equal strenght and power bilaterally and patient is ready to return to sport.  Long Term Goal 5 Progress: Progressing toward goal  Problem List Patient Active Problem List   Diagnosis Date Noted  . Stiffness of joint, not elsewhere classified, lower leg 11/08/2013  . Difficulty in walking(719.7) 11/08/2013  . Pain in joint, lower leg 11/08/2013  .  Menorrhagia 09/07/2013  . Hypertension 10/13/2012  . Depression with anxiety 10/13/2012       DeWitt, Cash R 12/15/2013, 8:56 AM

## 2013-12-19 ENCOUNTER — Ambulatory Visit (HOSPITAL_COMMUNITY)
Admission: RE | Admit: 2013-12-19 | Discharge: 2013-12-19 | Disposition: A | Discharge status: Home / Self Care | Payer: BC Managed Care – PPO | Source: Ambulatory Visit | Attending: Family Medicine | Admitting: Family Medicine

## 2013-12-19 DIAGNOSIS — IMO0001 Reserved for inherently not codable concepts without codable children: Secondary | ICD-10-CM | POA: Diagnosis not present

## 2013-12-19 NOTE — Progress Notes (Signed)
Physical Therapy Treatment Patient Details  Name: Eileen Nunez MRN: [id number] Date of Birth: 1981-06-21  Today's Date: 12/19/2013 Time: 0800-0858 PT Time Calculation (min): 58 min Charge: TE [id number], Manual [phone], Ice [phone]  Visit#: 12 of 24  Re-eval: 01/12/14 Assessment Diagnosis: Difficuty walking s/p ACL reconstruction  Surgical Date: 10/24/13 Next MD Visit: Orie FishermanMurphey 01/16/2014  Authorization: [employer]  Authorization Time Period:    Authorization Visit#: 12 of 24   Subjective: Symptoms/Limitations Symptoms: Pt reports she has been doing a lot of walking this weekend, increased swelling this session.  Minimum pain.   Pain Assessment Currently in Pain?: Yes Pain Score: 2  Pain Location: Knee Pain Orientation: Right  Precautions/Restrictions  Precautions Precautions: Other (comment) Precaution Comments: ACL protocol: 1-3 weeks (10/15/2013): squat 0-40 degrees, leg press, heel/toe raises, AROM 30-100 degrees;  3-4 weeks: 4" step downs, wall sits, sport cord, AROM 0-130; 6-12 weeks AROM WNL rebounder balancing SLS, lunges at 7 weeks, isometric wall sits 90degrees at week 6, agiility exercises (fast feet, front and side controlled hopping Bil LE; Week 12-16 Full strength once quad 70% on the involvted LE begin TM running and plyometric (jump rope, figure 8, shuttle); 16 weeks 70% strength correlation must be acheived with 80% agility correlation  Exercise/Treatments Stretches Quad Stretch: Limitations LobbyistQuad Stretch Limitations: standing 3x 20" Hip Flexor Stretch Limitations: on 14" 10x 3seconds Gastroc Stretch: 3 reps;30 seconds;Limitations Gastroc Stretch Limitations: slant board Aerobic Stationary Bike: 8 minutes seat 9 full revs Plyometrics Bilateral Jumping: 20 reps;Limitations Bilateral Jumping Limitations: in place, forward/backward and Rt?   Other Plyometric Exercises: agility ladder Bil 2RT forward and sideways Standing Forward Lunges: 20 reps Forward Lunges  Limitations: to 4" box 10x Side Lunges: 20 reps Side Lunges Limitations: to 4" box 10x.  Lateral Step Up: Right;20 reps;Hand Hold: 0;Step Height: 8" Forward Step Up: Right;20 reps;Hand Hold: 0;Step Height: 8" Step Down: Right;20 reps;Hand Hold: 0;Step Height: 6" Functional Squat Limitations: Squat reach matrix with 5lb dumbbell 5x Wall Squat: 3 sets;Limitations Wall Squat Limitations: 30" holds 90 degrees Rebounder: Rt SLS red ball 20reps  Modalities Modalities: Cryotherapy Manual Therapy Manual Therapy: Other (comment) Joint Mobilization: Patella mobs all directions, tib/fib joint mobs Massage: scar massage, MFR to Rectus femoris and vastus lateralis Cryotherapy Number Minutes Cryotherapy: 10 Minutes Cryotherapy Location: Knee Type of Cryotherapy: Ice pack  Physical Therapy Assessment and Plan PT Assessment and Plan Clinical Impression Statement: Week 8; Progressed to plyometric training per ACL protocol.  Pt educated on proper landing techniques to improve form and reduce risk of injury with multimodal cueing for technique.  Progressed depth with lunges decreased to 4in heigh and increased quad demand with increase to 8in step per PT plan.  No reports of pain through session noted visible fatigue.  Manual techniques complete to improve  rectus femoris lengthening with pin and stretch and standing quad stretch.  Ended with ice for pain and edema control. PT Plan:  Prpgress per ACL protocol for week 8, Continue lunges prgressing depth to 4" and progress step ups to 8"., quad strengthening for knee extension.  Progress depth of loading to improve strength. continue thomas stretch  Begin fast feet next session and continue agility ladder activiity plyometrics BIL controlled hopping.    Goals PT Long Term Goals Long Term Goal 3: Patient will display knee flexion strength of 5/5 in preparation for jumping, and cutting exercises for patient to return to sport Long Term Goal 3 Progress:  Progressing toward goal Long Term Goal 4: patient will  be able to perform Single leg squat to depth of >100 degrees of knee flexion and single leg squat endurance test to 18" box >15x in one minute indicatign patient is ready for return to sport testing PT Long Term Goal 5: Patient Will be able to perform 3x single leg hop within 5% of uninvolved side to demsontrate equal strenght and power bilaterally and patient is ready to return to sport.  Long Term Goal 5 Progress: Progressing toward goal  Problem List Patient Active Problem List   Diagnosis Date Noted  . Stiffness of joint, not elsewhere classified, lower leg 11/08/2013  . Difficulty in walking(719.7) 11/08/2013  . Pain in joint, lower leg 11/08/2013  . Menorrhagia 09/07/2013  . Hypertension 10/13/2012  . Depression with anxiety 10/13/2012    PT - End of Session Activity Tolerance: Patient tolerated treatment well General Behavior During Therapy: [facility] tasks assessed/performed  GP    Juel BurrowCockerham, Casey Jo 12/19/2013, 12:16 PM

## 2013-12-21 ENCOUNTER — Ambulatory Visit (HOSPITAL_COMMUNITY): Payer: BC Managed Care – PPO | Admitting: Physical Therapy

## 2013-12-27 ENCOUNTER — Ambulatory Visit (HOSPITAL_COMMUNITY)
Admission: RE | Admit: 2013-12-27 | Discharge: 2013-12-27 | Disposition: A | Discharge status: Home / Self Care | Payer: BC Managed Care – PPO | Source: Ambulatory Visit | Attending: Family Medicine | Admitting: Family Medicine

## 2013-12-27 DIAGNOSIS — IMO0001 Reserved for inherently not codable concepts without codable children: Secondary | ICD-10-CM | POA: Insufficient documentation

## 2013-12-27 DIAGNOSIS — M25569 Pain in unspecified knee: Secondary | ICD-10-CM | POA: Insufficient documentation

## 2013-12-27 DIAGNOSIS — M25669 Stiffness of unspecified knee, not elsewhere classified: Secondary | ICD-10-CM | POA: Insufficient documentation

## 2013-12-27 DIAGNOSIS — I10 Essential (primary) hypertension: Secondary | ICD-10-CM | POA: Insufficient documentation

## 2013-12-27 DIAGNOSIS — M6281 Muscle weakness (generalized): Secondary | ICD-10-CM | POA: Insufficient documentation

## 2013-12-27 DIAGNOSIS — R262 Difficulty in walking, not elsewhere classified: Secondary | ICD-10-CM | POA: Insufficient documentation

## 2013-12-27 NOTE — Progress Notes (Signed)
Physical Therapy Treatment Patient Details  Name: Eileen Nunez MRN: [id number] Date of Birth: 12/18/1980  Today's Date: 12/27/2013 Time: 0800-0853 PT Time Calculation (min): 53 min Charge TE [id number], Ice [phone].  Visit#: 13 of 24  Re-eval: 01/12/14 Assessment Diagnosis: Difficuty walking s/p ACL reconstruction  Surgical Date: 10/24/13 Next MD Visit: Orie FishermanMurphey 01/16/2014  Authorization: [employer]  Authorization Time Period:    Authorization Visit#: 13 of 24   Subjective: Symptoms/Limitations Symptoms: patient reports the knee continuing to feel stiff but is otherwise much improved. Cotninues to have minor swelling. No pain reported Pain Assessment Currently in Pain?: No/denies  Precautions/Restrictions  Precautions Precautions: Other (comment) Precaution Comments: ACL protocol: 1-3 weeks (10/15/2013): squat 0-40 degrees, leg press, heel/toe raises, AROM 30-100 degrees;  3-4 weeks: 4" step downs, wall sits, sport cord, AROM 0-130; 6-12 weeks AROM WNL rebounder balancing SLS, lunges at 7 weeks, isometric wall sits 90degrees at week 6, agiility exercises (fast feet, front and side controlled hopping Bil LE; Week 12-16 Full strength once quad 70% on the involvted LE begin TM running and plyometric (jump rope, figure 8, shuttle); 16 weeks 70% strength correlation must be acheived with 80% agility correlation  Exercise/Treatments Stretches Active Hamstring Stretch Limitations: 14" 3 way hamstring stretch 10x 3seconds Quad Stretch Limitations: standing 3x 20" Hip Flexor Stretch Limitations: 3way to 8" 10x 3 seconmds Gastroc Stretch: 3 reps;30 seconds;Limitations Plyometrics Bilateral Jumping: 20 reps;Limitations Bilateral Jumping Limitations: in place, forward/backward and Rt?   Other Plyometric Exercises: agility ladder Bil 2RT forward and sideways; fast feet in place, forward, lateral Standing Forward Lunges: 20 reps Forward Lunges Limitations: to 4" box 20x Side Lunges: 20  reps Side Lunges Limitations: to 4" box 10x.  Lateral Step Up: Right;20 reps;Hand Hold: 0;Step Height: 8" Forward Step Up: Right;20 reps;Hand Hold: 0;Step Height: 8" Step Down: Right;20 reps;Hand Hold: 0;Step Height: 6" Wall Squat: 3 sets;Limitations Wall Squat Limitations: 30" holds 90 degrees SLS with Vectors: Single leg balance reach matrix common and uncommon 5x Rebounder: Rt SLS red ball 20reps on airex Other Standing Knee Exercises: Fitter 20x  Other Standing Knee Exercises: Sumo walk with green Tband 2x 1 lap at knee and 1 lap at ankle height.    Modalities Modalities: Cryotherapy Cryotherapy Number Minutes Cryotherapy: 10 Minutes Cryotherapy Location: Knee Type of Cryotherapy: Ice pack  Physical Therapy Assessment and Plan PT Assessment and Plan Clinical Impression Statement: Week 9:  Contnued with BIl plyometric training per ACL protocol.  Began fast feet and resumed single leg reaching to improve stabilty.  Pt able to complete 1 single leg squat per PT request  to 76 degrees with visible  musculature fatigueRebounder complete on dynamic surface t PT Plan:  Prpgress per ACL protocol for week 9, Continue lunges prgressing depth to 4" and progress step ups to 8"., quad strengthening for knee extension.  Progress depth of loading to improve strength. continue thomas stretch  Continue agility ladder and fast feet activiity plyometrics BIL controlled hopping.    Goals PT Long Term Goals Long Term Goal 3 Progress: Progressing toward goal Long Term Goal 4 Progress: Progressing toward goal  Problem List Patient Active Problem List   Diagnosis Date Noted  . Stiffness of joint, not elsewhere classified, lower leg 11/08/2013  . Difficulty in walking(719.7) 11/08/2013  . Pain in joint, lower leg 11/08/2013  . Menorrhagia 09/07/2013  . Hypertension 10/13/2012  . Depression with anxiety 10/13/2012    PT - End of Session Activity Tolerance: Patient tolerated treatment  well General  Behavior During Therapy: [facility] for tasks assessed/performed  GP    Juel BurrowCockerham, Mohamedamin Nifong Jo 12/27/2013, 11:48 AM

## 2013-12-29 ENCOUNTER — Ambulatory Visit (HOSPITAL_COMMUNITY)
Admission: RE | Admit: 2013-12-29 | Discharge: 2013-12-29 | Disposition: A | Discharge status: Home / Self Care | Payer: BC Managed Care – PPO | Source: Ambulatory Visit | Attending: Family Medicine | Admitting: Family Medicine

## 2013-12-29 NOTE — Progress Notes (Signed)
Physical Therapy Treatment Patient Details  Name: Eileen Nunez MRN: [id number] Date of Birth: July 29, 1980  Today's Date: 12/29/2013 Time: 0800-0850 PT Time Calculation (min): 50 min    Charges: TherEx [phone], ice [phone] Visit#: 14 of 24  Re-eval: 01/12/14 Assessment Diagnosis: Difficuty walking s/p ACL reconstruction  Surgical Date: 10/24/13 Next MD Visit: Orie FishermanMurphey 01/16/2014  Authorization: [employer]  Authorization Time Period:    Authorization Visit#: 14 of 24   Subjective: Symptoms/Limitations Symptoms: Patient reports increased soreness, pain and swelling after last session.  Pain Assessment Currently in Pain?: Yes Pain Score: 2  Pain Location: Knee Pain Orientation: Right  Precautions/Restrictions  Precautions Precautions: Other (comment) Precaution Comments: ACL protocol: 1-3 weeks (10/15/2013): squat 0-40 degrees, leg press, heel/toe raises, AROM 30-100 degrees;  3-4 weeks: 4" step downs, wall sits, sport cord, AROM 0-130; 6-12 weeks AROM WNL rebounder balancing SLS, lunges at 7 weeks, isometric wall sits 90degrees at week 6, agiility exercises (fast feet, front and side controlled hopping Bil LE; Week 12-16 Full strength once quad 70% on the involvted LE begin TM running and plyometric (jump rope, figure 8, shuttle); 16 weeks 70% strength correlation must be acheived with 80% agility correlation  Exercise/Treatments Stretches Active Hamstring Stretch Limitations: 14" 3 way hamstring stretch 10x 3seconds Quad Stretch Limitations: Standing to x 3 seconds  Aerobic Stationary Bike: 5 minutes Plyometrics Other Plyometric Exercises: Agility ladder: forward, side, diagonals, grape vine, in-out, forward lateral, no jumping.  Standing Forward Lunges Limitations: Lunge matrix 5x to floor  Lateral Step Up: Limitations Lateral Step Up Limitations: 14" 3 way step ups Functional Squat Limitations: single leg tow touch, Squat reach matrix with 5lb dumbbell 5x SLS with Vectors: 4" box  Single leg balance reach matrix common and uncommon 5x Other Standing Knee Exercises: Walking single leg deadlift with yellow ball.   Cryotherapy Number Minutes Cryotherapy: 10 Minutes Cryotherapy Location: Knee Type of Cryotherapy: Ice pack  Physical Therapy Assessment and Plan PT Assessment and Plan Clinical Impression Statement: With patient noting increased pain and swelling following last session jumping and plyometrics were put on hold, with focus shifted toward increasing patient's depth and control of single leg loading. Patient demonstrated good performance, with no pain, of all exercises with minimal cuing. Will progress single leg squat depth to > 90 degrees before reinitiatring any jumping.  PT Plan:  Prpgress per ACL protocol for week 10, Continue progressing depth of lunging and step ups., quad strengthening for knee extension.  Progress depth of loading to improve strength. continue thomas stretch  Continue agility ladder and fast feet activiitities,     Goals PT Long Term Goals Long Term Goal 3: Patient will display knee flexion strength of 5/5 in preparation for jumping, and cutting exercises for patient to return to sport Long Term Goal 3 Progress: Progressing toward goal Long Term Goal 4: patient will be able to perform Single leg squat to depth of >100 degrees of knee flexion and single leg squat endurance test to 18" box >15x in one minute indicatign patient is ready for return to sport testing Long Term Goal 4 Progress: Progressing toward goal PT Long Term Goal 5: Patient Will be able to perform 3x single leg hop within 5% of uninvolved side to demsontrate equal strenght and power bilaterally and patient is ready to return to sport.  Long Term Goal 5 Progress: Progressing toward goal  Problem List Patient Active Problem List   Diagnosis Date Noted  . Stiffness of joint, not elsewhere classified, lower leg  11/08/2013  . Difficulty in walking(719.7) 11/08/2013  . Pain  in joint, lower leg 11/08/2013  . Menorrhagia 09/07/2013  . Hypertension 10/13/2012  . Depression with anxiety 10/13/2012    PT - End of Session Activity Tolerance: Patient tolerated treatment well General Behavior During Therapy: [facility] for tasks assessed/performed  GP    Laquita Harlan R 12/29/2013, 8:48 AM

## 2014-01-03 ENCOUNTER — Ambulatory Visit (HOSPITAL_COMMUNITY)
Admission: RE | Admit: 2014-01-03 | Discharge: 2014-01-03 | Disposition: A | Discharge status: Home / Self Care | Payer: BC Managed Care – PPO | Source: Ambulatory Visit

## 2014-01-03 NOTE — Progress Notes (Signed)
Physical Therapy Treatment Patient Details  Name: Eileen Nunez MRN: [id number] Date of Birth: 07-Aug-1980  Today's Date: 01/03/2014 Time: 0805-0845 PT Time Calculation (min): 40 min Charge: TE [id number]   Visit#: 15 of 24  Re-eval: 01/12/14 Assessment Diagnosis: Difficuty walking s/p ACL reconstruction  Surgical Date: 10/24/13 Next MD Visit: Orie FishermanMurphey 01/16/2014  Authorization: BC[employer]  Authorization Time Period:    Authorization Visit#: 15 of 24   Subjective: Symptoms/Limitations Symptoms: Pt stated she feels improvement with her knee, pain is minimun today. Pain Assessment Currently in Pain?: Yes Pain Score: 2  Pain Location: Knee Pain Orientation: Right  Precautions/Restrictions  Precautions Precautions: Other (comment) Precaution Comments: ACL protocol: 1-3 weeks (10/15/2013): squat 0-40 degrees, leg press, heel/toe raises, AROM 30-100 degrees;  3-4 weeks: 4" step downs, wall sits, sport cord, AROM 0-130; 6-12 weeks AROM WNL rebounder balancing SLS, lunges at 7 weeks, isometric wall sits 90degrees at week 6, agiility exercises (fast feet, front and side controlled hopping Bil LE; Week 12-16 Full strength once quad 70% on the involvted LE begin TM running and plyometric (jump rope, figure 8, shuttle); 16 weeks 70% strength correlation must be acheived with 80% agility correlation  Exercise/Treatments Stretches Active Hamstring Stretch: Limitations Active Hamstring Stretch Limitations: Walking single leg deadlift with yellow ball.  Quad Stretch: 3 reps;30 seconds;Limitations LobbyistQuad Stretch Limitations: half kneeling with foot on 6 in step Gastroc Stretch: 3 reps;30 seconds;Limitations Gastroc Stretch Limitations: slant board Aerobic Stationary Bike: 6 min seat 8 Plyometrics Other Plyometric Exercises: Agility ladder: forward, side, diagonals, grape vine, in-out, forward lateral, no jumping.  Standing Forward Lunges Limitations: Lunge matrix 5x to floor  Lateral Step Up:  Limitations Lateral Step Up Limitations: 14" 3 way step ups Functional Squat Limitations: single leg toe touch, Squat reach matrix with 5lb dumbbell 5x SLS with Vectors: 4" box Single leg balance reach matrix common and uncommon 5x Other Standing Knee Exercises: Walking single leg deadlift with yellow ball.  Other Standing Knee Exercises: Sumo walk with green Tband 2x 1 lap at knee and 1 lap at ankle height.     Physical Therapy Assessment and Plan PT Assessment and Plan Clinical Impression Statement: Week 10, followed ACL protocol . Focus on session on knee stability.  Pt able to acheive 78 degtrees with single leg squat with Lt toe touch assistance. Pt with decreased coordination with agility ladder activties, improved sequencing following reps.  No reports of pain today, no ice at end of session.   PT Plan:  Prpgress per ACL protocol for week 10, Continue progressing depth of lunging and step ups., quad strengthening for knee extension.  Progress depth of loading to improve strength. continue thomas stretch  Continue agility ladder fast feet activiitities, hold jumping until pt able to acheive 90 degrees single leg squat/      Goals PT Long Term Goals Long Term Goal 3: Patient will display knee flexion strength of 5/5 in preparation for jumping, and cutting exercises for patient to return to sport Long Term Goal 3 Progress: Progressing toward goal Long Term Goal 4: patient will be able to perform Single leg squat to depth of >100 degrees of knee flexion and single leg squat endurance test to 18" box >15x in one minute indicatign patient is ready for return to sport testing Long Term Goal 4 Progress: Progressing toward goal  Problem List Patient Active Problem List   Diagnosis Date Noted  . Stiffness of joint, not elsewhere classified, lower leg 11/08/2013  . Difficulty in walking(719.7)  11/08/2013  . Pain in joint, lower leg 11/08/2013  . Menorrhagia 09/07/2013  . Hypertension  10/13/2012  . Depression with anxiety 10/13/2012    PT - End of Session Activity Tolerance: Patient tolerated treatment well General Behavior During Therapy: [facility] for tasks assessed/performed  GP    Juel Burrow 01/03/2014, 9:06 AM

## 2014-01-05 ENCOUNTER — Ambulatory Visit (HOSPITAL_COMMUNITY)
Admission: RE | Admit: 2014-01-05 | Discharge: 2014-01-05 | Disposition: A | Discharge status: Home / Self Care | Payer: BC Managed Care – PPO | Source: Ambulatory Visit | Attending: Physical Therapy | Admitting: Physical Therapy

## 2014-01-05 NOTE — Progress Notes (Signed)
Physical Therapy Treatment Patient Details  Name: Eileen Nunez MRN: [id number] Date of Birth: 1980/07/23  Today's Date: 01/05/2014 Time: 0802-0850 PT Time Calculation (min): 48 min Charge: TE 4098-1191, YNW [id number]  Visit#: 16 of 24  Re-eval: 01/12/14 Assessment Diagnosis: Difficuty walking s/p ACL reconstruction  Surgical Date: 10/24/13 Next MD Visit: Orie Fisherman 01/16/2014  Authorization: [employer]  Authorization Time Period:    Authorization Visit#: 16 of 24   Subjective: Symptoms/Limitations Symptoms: Pt stated hamstring soreness, pain minimum today. Pain Assessment Currently in Pain?: Yes Pain Score: 2  Pain Location: Knee Pain Orientation: Right  Precautions/Restrictions  Precautions Precautions: Other (comment) Precaution Comments: ACL protocol: 1-3 weeks (10/15/2013): squat 0-40 degrees, leg press, heel/toe raises, AROM 30-100 degrees;  3-4 weeks: 4" step downs, wall sits, sport cord, AROM 0-130; 6-12 weeks AROM WNL rebounder balancing SLS, lunges at 7 weeks, isometric wall sits 90degrees at week 6, agiility exercises (fast feet, front and side controlled hopping Bil LE; Week 12-16 Full strength once quad 70% on the involvted LE begin TM running and plyometric (jump rope, figure 8, shuttle); 16 weeks 70% strength correlation must be acheived with 80% agility correlation  Exercise/Treatments Stretches Active Hamstring Stretch: Limitations Active Hamstring Stretch Limitations: Walking single leg deadlift with yellow ball.  Quad Stretch: 3 reps;30 seconds;Limitations Lobbyist Limitations: half kneeling with foot on 6 in step Aerobic Elliptical: 5 min L1 Plyometrics Other Plyometric Exercises: Agility ladder: forward, side, diagonals, grape vine, in-out, forward lateral, no jumping.  Standing Forward Lunges Limitations: Lunge matrix 5x to floor  Lateral Step Up: Right;15 reps;Hand Hold: 0;Step Height: 8" Functional Squat Limitations: single leg toe touch, Squat reach  matrix with 5lb dumbbell 5x SLS with Vectors: 4" box Single leg balance reach matrix common and uncommon 5x Other Standing Knee Exercises: Walking single leg deadlift with yellow ball.   Modalities Modalities: Cryotherapy Cryotherapy Number Minutes Cryotherapy: 10 Minutes Cryotherapy Location: Knee Type of Cryotherapy: Ice pack  Physical Therapy Assessment and Plan PT Assessment and Plan Clinical Impression Statement: Week 10 per ACL protocol.  Pt improving coordination with agility ladder activities with good sequence this session.  Pt able to acheive 80 degree single leg squat.  Changed aerobic to elliptical to improve terminal extension with gait.  Ice applied end of session for pain and edema control.   PT Plan:  Prpgress per ACL protocol for week 10, Continue progressing depth of lunging and step ups., quad strengthening for knee extension.  Progress depth of loading to improve strength. continue thomas stretch  Continue agility ladder fast feet activiitities, hold jumping until pt able to acheive 90 degrees single leg squat/      Goals PT Long Term Goals Long Term Goal 3: Patient will display knee flexion strength of 5/5 in preparation for jumping, and cutting exercises for patient to return to sport Long Term Goal 4: patient will be able to perform Single leg squat to depth of >100 degrees of knee flexion and single leg squat endurance test to 18" box >15x in one minute indicatign patient is ready for return to sport testing PT Long Term Goal 5: Patient Will be able to perform 3x single leg hop within 5% of uninvolved side to demsontrate equal strenght and power bilaterally and patient is ready to return to sport.   Problem List Patient Active Problem List   Diagnosis Date Noted  . Stiffness of joint, not elsewhere classified, lower leg 11/08/2013  . Difficulty in walking(719.7) 11/08/2013  . Pain in joint, lower  leg 11/08/2013  . Menorrhagia 09/07/2013  . Hypertension 10/13/2012   . Depression with anxiety 10/13/2012    PT - End of Session Activity Tolerance: Patient tolerated treatment well General Behavior During Therapy: [facility] for tasks assessed/performed  GP    Juel BurrowCockerham, Rayn Shorb Jo 01/05/2014, 8:42 AM

## 2014-01-10 ENCOUNTER — Ambulatory Visit (HOSPITAL_COMMUNITY)
Admission: RE | Admit: 2014-01-10 | Discharge: 2014-01-10 | Disposition: A | Discharge status: Home / Self Care | Payer: BC Managed Care – PPO | Source: Ambulatory Visit | Attending: Family Medicine | Admitting: Family Medicine

## 2014-01-10 NOTE — Progress Notes (Signed)
Physical Therapy Treatment Patient Details  Name: Eileen Nunez MRN: [id number] Date of Birth: 12-Apr-1981  Today's Date: 01/10/2014 Time: [id number] PT Time Calculation (min): 51 min  Visit#: 17 of 24  Re-eval: 01/12/14 Authorization: [employer]BS  Authorization Visit#: 17 of 24 Charges:  therex 38' icepack 10'  Subjective: Symptoms/Limitations Symptoms: No pain, just occasional popping and clicking with lateral stress.   Pain Assessment Currently in Pain?: No/denies   Exercise/Treatments Stretches Gastroc Stretch: 3 reps;30 seconds;Limitations Gastroc Stretch Limitations: slant board Aerobic Stationary Bike: 8 min seat 8 Elliptical: broken Plyometrics Other Plyometric Exercises: Agility ladder: forward, side, diagonals, grape vine, in-out, forward lateral, no jumping.  Standing Forward Lunges Limitations: Lunge matrix 5x to floor  Lateral Step Up: Right;15 reps;Hand Hold: 0;Step Height: 8" Lateral Step Up Limitations: 14" 3 way step ups Functional Squat: 10 reps;Limitations Functional Squat Limitations: single leg toe touch, Squat reach matrix with 5lb dumbbell 5x Wall Squat: 3 sets;Limitations Wall Squat Limitations: 30 second holds SLS with Vectors: 6" box Single leg balance reach matrix common and uncommon 5x Other Standing Knee Exercises: Walking single leg deadlift with yellow ball.     Physical Therapy Assessment and Plan PT Assessment and Plan Clinical Impression Statement: Week 11 ACL protocol.  Overall improvement with ability to complete SLS squats.  Most difficulty with transverse plane movements.  Increased to 6" step downs today.  Continued with ice at end of session to decrease edema/discomfort.  PT Plan:  Prpgress per ACL protocol for week 10, Continue progressing depth of lunging and step ups., quad strengthening for knee extension.  Progress depth of loading to improve strength. continue thomas stretch  Continue agility ladder fast feet activiitities, hold  jumping until pt able to acheive 90 degrees single leg squat/       Problem List Patient Active Problem List   Diagnosis Date Noted  . Stiffness of joint, not elsewhere classified, lower leg 11/08/2013  . Difficulty in walking(719.7) 11/08/2013  . Pain in joint, lower leg 11/08/2013  . Menorrhagia 09/07/2013  . Hypertension 10/13/2012  . Depression with anxiety 10/13/2012    PT - End of Session Activity Tolerance: Patient tolerated treatment well General Behavior During Therapy: [facility] for tasks assessed/performed  GP    Lurena NidaAmy B Kaetlyn Noa, PTA/CLT 01/10/2014, 9:36 AM

## 2014-01-12 ENCOUNTER — Ambulatory Visit (HOSPITAL_COMMUNITY)
Admission: RE | Admit: 2014-01-12 | Discharge: 2014-01-12 | Disposition: A | Discharge status: Home / Self Care | Payer: BC Managed Care – PPO | Source: Ambulatory Visit | Attending: Orthopedic Surgery | Admitting: Orthopedic Surgery

## 2014-01-12 NOTE — Progress Notes (Signed)
Physical Therapy Re-evaluation/Treatment Note  Patient Details  Name: Eileen Nunez MRN: [id number] Date of Birth: 1980/09/22  Today's Date: 01/12/2014 Time: 0802-0852 PT Time Calculation (min): 50 min Charge: TE 2409-7353, MMT [phone], Ice [phone]               Visit#: 18 of 24  Re-eval: 02/09/14 Assessment Diagnosis: Difficuty walking s/p ACL reconstruction  Surgical Date: 10/24/13 Next MD Visit: Maretta Los 01/16/2014  Authorization: [employer]    Authorization Time Period:    Authorization Visit#: 18 of 24   Subjective Symptoms/Limitations Symptoms: Pain minimal today. Pain Assessment Currently in Pain?: Yes Pain Score: 1  Pain Location: Knee Pain Orientation: Right  Precautions/Restrictions  Precautions Precautions: Other (comment) Precaution Comments: ACL protocol: 1-3 weeks (10/15/2013): squat 0-40 degrees, leg press, heel/toe raises, AROM 30-100 degrees;  3-4 weeks: 4" step downs, wall sits, sport cord, AROM 0-130; 6-12 weeks AROM WNL rebounder balancing SLS, lunges at 7 weeks, isometric wall sits 90degrees at week 6, agiility exercises (fast feet, front and side controlled hopping Bil LE; Week 12-16 Full strength once quad 70% on the involvted LE begin TM running and plyometric (jump rope, figure 8, shuttle); 16 weeks 70% strength correlation must be acheived with 80% agility correlation  Objective:   Assessment RLE Strength Right Hip Extension:  (4+/5 was 4/5) Right Hip ABduction:  (4+/5 was 4/5) Right Knee Flexion:  (4+/5 was 4/5) Right Knee Extension: 5/5 Right Ankle Dorsiflexion: 5/5  Exercise/Treatments Stretches Active Hamstring Stretch: Limitations Active Hamstring Stretch Limitations: Walking single leg deadlift with yellow ball.  Quad Stretch: 3 reps;30 seconds;Limitations Sports administrator Limitations: standing Gastroc Stretch: 3 reps;30 seconds;Limitations Gastroc Stretch Limitations: slant board Aerobic Stationary Bike: 8 min seat 8 Elliptical:  broken Plyometrics Other Plyometric Exercises: Agility ladder: forward, side, diagonals, grape vine, in-out, forward lateral, no jumping.  Standing Forward Lunges Limitations: Lunge matrix 5x to floor  Lateral Step Up: Right;15 reps;Hand Hold: 0;Step Height: 8" Lateral Step Up Limitations: 3 way step ups Functional Squat: 10 reps;Limitations Functional Squat Limitations: single leg toe touch, Squat reach matrix with 5lb dumbbell 5x SLS with Vectors: 6" box Single leg balance reach matrix common and uncommon 5x Other Standing Knee Exercises:     Modalities Modalities: Cryotherapy Cryotherapy Number Minutes Cryotherapy: 10 Minutes Cryotherapy Location: Knee Type of Cryotherapy: Ice pack  Physical Therapy Assessment and Plan PT Assessment and Plan Clinical Impression Statement: Reassessment complete with the following findings:  Pt at week 11 ACL protocol and progressing well.  Overall knee stabilty is improving.  Pt able to complete 83 degrees SLS squats.  Overall strength progressing well towards goals.  End of sessoin applied  ice for edema and pain control.  No reports of increased pain through session. PT Plan:  Prpgress per ACL protocol for week 12 next session, Continue progressing depth of lunging and step ups., quad strengthening for knee extension.  Progress depth of loading to improve strength. continue thomas stretch  Continue agility ladder fast feet activiitities, hold jumping until pt able to acheive 90 degrees single leg squat/      Goals PT Short Term Goals PT Short Term Goal 1: Patient will be able to fully extend knee to Normalize gait PT Short Term Goal 1 - Progress: Met PT Short Term Goal 2: Patient will demosntrated Rt quadraceps strength of 4/5 to be able to perform sit to stand without UE support PT Short Term Goal 2 - Progress: Met PT Short Term Goal 3: Patient will be able to  flex knee to greater than 110 degrees to be able to perform stand t sit withotu UE  support.  PT Short Term Goal 3 - Progress: Met PT Short Term Goal 4: Patient will be able to tolerate standing for >26mnutes with pain <2/10 so patient can comfortably cook meals.  PT Short Term Goal 4 - Progress: Met PT Short Term Goal 5: Patient will be able to tolerate  walking >372mutes with pain <2/10 so patient can buy groceries. PT Short Term Goal 5 - Progress: Met PT Long Term Goals PT Long Term Goal 1: Patient will display full knee ROM (0-125 or greater) to fully normalize gait PT Long Term Goal 1 - Progress: Met PT Long Term Goal 2: Patient will display knee extension strength of 5/5 so be able to perform stair ambulation PT Long Term Goal 2 - Progress: Met Long Term Goal 3: Patient will display knee flexion strength of 5/5 in preparation for jumping, and cutting exercises for patient to return to sport Long Term Goal 4: patient will be able to perform Single leg squat to depth of >100 degrees of knee flexion and single leg squat endurance test to 18" box >15x in one minute indicatign patient is ready for return to sport testing Long Term Goal 4 Progress: Progressing toward goal PT Long Term Goal 5: Patient Will be able to perform 3x single leg hop within 5% of uninvolved side to demsontrate equal strenght and power bilaterally and patient is ready to return to sport.  Long Term Goal 5 Progress: Not met (Not addressed, waiting until able to SLS squat at least 90 degrees)  Problem List Patient Active Problem List   Diagnosis Date Noted  . Stiffness of joint, not elsewhere classified, lower leg 11/08/2013  . Difficulty in walking(719.7) 11/08/2013  . Pain in joint, lower leg 11/08/2013  . Menorrhagia 09/07/2013  . Hypertension 10/13/2012  . Depression with anxiety 10/13/2012    PT - End of Session Activity Tolerance: Patient tolerated treatment well General Behavior During Therapy: [facility] tasks assessed/performed  GP    CoAldona Lento/23/2015, 9:01  AM  Physician Documentation Your signature is required to indicate approval of the treatment plan as stated above.  Please sign and either send electronically or make a copy of this report for your files and return this physician signed original.   Please mark one 1.__approve of plan  2. ___approve of plan with the following conditions.   ______________________________                                                          _____________________ Physician Signature                                                                                                             Date

## 2014-01-12 NOTE — Progress Notes (Signed)
Physical Therapy Re-evaluation/Treatment Note  Patient Details  Name: Eileen Nunez MRN: [id number] Date of Birth: 1980/09/22  Today's Date: 01/12/2014 Time: 0802-0852 PT Time Calculation (min): 50 min Charge: TE 2409-7353, MMT [phone], Ice [phone]               Visit#: 18 of 24  Re-eval: 02/09/14 Assessment Diagnosis: Difficuty walking s/p ACL reconstruction  Surgical Date: 10/24/13 Next MD Visit: Maretta Los 01/16/2014  Authorization: [employer]    Authorization Time Period:    Authorization Visit#: 18 of 24   Subjective Symptoms/Limitations Symptoms: Pain minimal today. Pain Assessment Currently in Pain?: Yes Pain Score: 1  Pain Location: Knee Pain Orientation: Right  Precautions/Restrictions  Precautions Precautions: Other (comment) Precaution Comments: ACL protocol: 1-3 weeks (10/15/2013): squat 0-40 degrees, leg press, heel/toe raises, AROM 30-100 degrees;  3-4 weeks: 4" step downs, wall sits, sport cord, AROM 0-130; 6-12 weeks AROM WNL rebounder balancing SLS, lunges at 7 weeks, isometric wall sits 90degrees at week 6, agiility exercises (fast feet, front and side controlled hopping Bil LE; Week 12-16 Full strength once quad 70% on the involvted LE begin TM running and plyometric (jump rope, figure 8, shuttle); 16 weeks 70% strength correlation must be acheived with 80% agility correlation  Objective:   Assessment RLE Strength Right Hip Extension:  (4+/5 was 4/5) Right Hip ABduction:  (4+/5 was 4/5) Right Knee Flexion:  (4+/5 was 4/5) Right Knee Extension: 5/5 Right Ankle Dorsiflexion: 5/5  Exercise/Treatments Stretches Active Hamstring Stretch: Limitations Active Hamstring Stretch Limitations: Walking single leg deadlift with yellow ball.  Quad Stretch: 3 reps;30 seconds;Limitations Sports administrator Limitations: standing Gastroc Stretch: 3 reps;30 seconds;Limitations Gastroc Stretch Limitations: slant board Aerobic Stationary Bike: 8 min seat 8 Elliptical:  broken Plyometrics Other Plyometric Exercises: Agility ladder: forward, side, diagonals, grape vine, in-out, forward lateral, no jumping.  Standing Forward Lunges Limitations: Lunge matrix 5x to floor  Lateral Step Up: Right;15 reps;Hand Hold: 0;Step Height: 8" Lateral Step Up Limitations: 3 way step ups Functional Squat: 10 reps;Limitations Functional Squat Limitations: single leg toe touch, Squat reach matrix with 5lb dumbbell 5x SLS with Vectors: 6" box Single leg balance reach matrix common and uncommon 5x Other Standing Knee Exercises:     Modalities Modalities: Cryotherapy Cryotherapy Number Minutes Cryotherapy: 10 Minutes Cryotherapy Location: Knee Type of Cryotherapy: Ice pack  Physical Therapy Assessment and Plan PT Assessment and Plan Clinical Impression Statement: Reassessment complete with the following findings:  Pt at week 11 ACL protocol and progressing well.  Overall knee stabilty is improving.  Pt able to complete 83 degrees SLS squats.  Overall strength progressing well towards goals.  End of sessoin applied  ice for edema and pain control.  No reports of increased pain through session. PT Plan:  Prpgress per ACL protocol for week 12 next session, Continue progressing depth of lunging and step ups., quad strengthening for knee extension.  Progress depth of loading to improve strength. continue thomas stretch  Continue agility ladder fast feet activiitities, hold jumping until pt able to acheive 90 degrees single leg squat/      Goals PT Short Term Goals PT Short Term Goal 1: Patient will be able to fully extend knee to Normalize gait PT Short Term Goal 1 - Progress: Met PT Short Term Goal 2: Patient will demosntrated Rt quadraceps strength of 4/5 to be able to perform sit to stand without UE support PT Short Term Goal 2 - Progress: Met PT Short Term Goal 3: Patient will be able to  flex knee to greater than 110 degrees to be able to perform stand t sit withotu UE  support.  PT Short Term Goal 3 - Progress: Met PT Short Term Goal 4: Patient will be able to tolerate standing for >26minutes with pain <2/10 so patient can comfortably cook meals.  PT Short Term Goal 4 - Progress: Met PT Short Term Goal 5: Patient will be able to tolerate  walking >51minutes with pain <2/10 so patient can buy groceries. PT Short Term Goal 5 - Progress: Met PT Long Term Goals PT Long Term Goal 1: Patient will display full knee ROM (0-125 or greater) to fully normalize gait PT Long Term Goal 1 - Progress: Met PT Long Term Goal 2: Patient will display knee extension strength of 5/5 so be able to perform stair ambulation PT Long Term Goal 2 - Progress: Met Long Term Goal 3: Patient will display knee flexion strength of 5/5 in preparation for jumping, and cutting exercises for patient to return to sport Long Term Goal 4: patient will be able to perform Single leg squat to depth of >100 degrees of knee flexion and single leg squat endurance test to 18" box >15x in one minute indicatign patient is ready for return to sport testing Long Term Goal 4 Progress: Progressing toward goal PT Long Term Goal 5: Patient Will be able to perform 3x single leg hop within 5% of uninvolved side to demsontrate equal strenght and power bilaterally and patient is ready to return to sport.  Long Term Goal 5 Progress: Not met (Not addressed, waiting until able to SLS squat at least 90 degrees)  Problem List Patient Active Problem List   Diagnosis Date Noted  . Stiffness of joint, not elsewhere classified, lower leg 11/08/2013  . Difficulty in walking(719.7) 11/08/2013  . Pain in joint, lower leg 11/08/2013  . Menorrhagia 09/07/2013  . Hypertension 10/13/2012  . Depression with anxiety 10/13/2012    PT - End of Session Activity Tolerance: Patient tolerated treatment well General Behavior During Therapy: [facility] for tasks assessed/performed  GP    Aldona Lento 01/12/2014, 9:01 AM  Devona Konig PT DPT  Physician Documentation Your signature is required to indicate approval of the treatment plan as stated above.  Please sign and either send electronically or make a copy of this report for your files and return this physician signed original.   Please mark one 1.__approve of plan  2. ___approve of plan with the following conditions.   ______________________________                                                          _____________________ Physician Signature                                                                                                             Date

## 2014-01-17 ENCOUNTER — Ambulatory Visit (HOSPITAL_COMMUNITY)
Admission: RE | Admit: 2014-01-17 | Discharge: 2014-01-17 | Disposition: A | Discharge status: Home / Self Care | Payer: BC Managed Care – PPO | Source: Ambulatory Visit | Attending: Physical Therapy | Admitting: Physical Therapy

## 2014-01-17 NOTE — Progress Notes (Signed)
Physical Therapy Treatment Patient Details  Name: Eileen Nunez MRN: [id number] Date of Birth: October 13, 1980  Today's Date: 01/17/2014 Time: 0802-0856 PT Time Calculation (min): 54 min Charge: TE [id number], [id number], Ice [id number]   Visit#: 19 of 24  Re-eval: 02/09/14 Assessment Diagnosis: Difficuty walking s/p ACL reconstruction  Surgical Date: 10/24/13 Next MD Visit: Orie FishermanMurphey 01/18/2014  Authorization: [employer]  Authorization Time Period:    Authorization Visit#: 19 of 24   Subjective: Symptoms/Limitations Symptoms: Pt c/o popping during gait, not painful.  Current pain scale 2/10 Pain Assessment Currently in Pain?: Yes Pain Score: 2  Pain Location: Knee Pain Orientation: Right  Precautions/Restrictions  Precautions Precautions: Other (comment) Precaution Comments: ACL protocol: 1-3 weeks (10/15/2013): squat 0-40 degrees, leg press, heel/toe raises, AROM 30-100 degrees;  3-4 weeks: 4" step downs, wall sits, sport cord, AROM 0-130; 6-12 weeks AROM WNL rebounder balancing SLS, lunges at 7 weeks, isometric wall sits 90degrees at week 6, agiility exercises (fast feet, front and side controlled hopping Bil LE; Week 12-16 Full strength once quad 70% on the involvted LE begin TM running and plyometric (jump rope, figure 8, shuttle); 16 weeks 70% strength correlation must be acheived with 80% agility correlation  Exercise/Treatments Stretches Active Hamstring Stretch: Limitations Active Hamstring Stretch Limitations: Walking single leg deadlift with yellow ball; 3 directions Quad Stretch: 3 reps;30 seconds;Limitations Quad Stretch Limitations: standing Gastroc Stretch: 3 reps;30 seconds;Limitations Gastroc Stretch Limitations: slant board Aerobic Stationary Bike: 8 min seat 8 Elliptical: broken Plyometrics Other Plyometric Exercises: Agility ladder: forward, side, diagonals, grape vine, in-out, forward lateral, no jumping.  Standing Forward Lunges Limitations: Lunge matrix 5x to floor   Lateral Step Up: Right;15 reps;Hand Hold: 0;Step Height: 8" Lateral Step Up Limitations: 3 way step ups Functional Squat: 10 reps;Limitations Functional Squat Limitations: single leg toe touch, Squat reach matrix with 5lb dumbbell 5x Wall Squat: 3 sets;Limitations Wall Squat Limitations: 30 second holds SLS with Vectors: 6" box Single leg balance reach matrix common and uncommon 5x   Modalities Modalities: Cryotherapy Cryotherapy Number Minutes Cryotherapy: 10 Minutes Cryotherapy Location: Knee Type of Cryotherapy: Ice pack  Physical Therapy Assessment and Plan PT Assessment and Plan Clinical Impression Statement: Week 12 per ACL protocol.  Continued to hold plyometrics, pt able to achieve 83 degrees SLS squats.  Pt coordination and functional strengtheing improving with increased ease.noted today with agility ladder. PT Plan:  Prpgress per ACL protocol for week 12 next session, Continue progressing depth of lunging and step ups., quad strengthening for knee extension.  Progress depth of loading to improve strength. continue thomas stretch  Continue agility ladder fast feet activiitities, hold jumping until pt able to acheive 90 degrees single leg squat/      Goals    Problem List Patient Active Problem List   Diagnosis Date Noted  . Stiffness of joint, not elsewhere classified, lower leg 11/08/2013  . Difficulty in walking(719.7) 11/08/2013  . Pain in joint, lower leg 11/08/2013  . Menorrhagia 09/07/2013  . Hypertension 10/13/2012  . Depression with anxiety 10/13/2012    PT - End of Session Activity Tolerance: Patient tolerated treatment well General Behavior During Therapy: [facility] tasks assessed/performed  GP    Juel BurrowCockerham, Casey Jo 01/17/2014, 9:16 AM

## 2014-01-19 ENCOUNTER — Ambulatory Visit (HOSPITAL_COMMUNITY)
Admission: RE | Admit: 2014-01-19 | Discharge: 2014-01-19 | Disposition: A | Discharge status: Home / Self Care | Payer: BC Managed Care – PPO | Source: Ambulatory Visit | Attending: Orthopedic Surgery | Admitting: Orthopedic Surgery

## 2014-01-19 ENCOUNTER — Other Ambulatory Visit: Payer: Self-pay | Admitting: Nurse Practitioner

## 2014-01-19 NOTE — Progress Notes (Signed)
Physical Therapy Treatment Patient Details  Name: Eileen Nunez MRN: [id number] Date of Birth: [birth year]  Today's Date: 01/19/2014 Time: 0802-0845 PT Time Calculation (min): 43 min   Charges: therEx [id number] Visit#: 20 of   24 Re-eval: 02/09/14 Assessment Diagnosis: Difficuty walking s/p ACL reconstruction  Surgical Date: 10/24/13 Next MD Visit: Orie FishermanMurphey 01/18/2014  Authorization: BCBS  Authorization Visit#: 20 of  24   Subjective:   No pain, just minor stiffness secondary to minor swelling.   Precautions/Restrictions  Precautions Precautions: Other (comment) Precaution Comments: ACL protocol: 1-3 weeks (10/15/2013): squat 0-40 degrees, leg press, heel/toe raises, AROM 30-100 degrees;  3-4 weeks: 4" step downs, wall sits, sport cord, AROM 0-130; 6-12 weeks AROM WNL rebounder balancing SLS, lunges at 7 weeks, isometric wall sits 90degrees at week 6, agiility exercises (fast feet, front and side controlled hopping Bil LE; Week 12-16 Full strength once quad 70% on the involvted LE begin TM running and plyometric (jump rope, figure 8, shuttle); 16 weeks 70% strength correlation must be acheived with 80% agility correlation  Exercise/Treatments Stretches Active Hamstring Stretch Limitations: 14" 3 way hamstring stretch 10x 3seconds Quad Stretch: 3 reps;30 seconds;Limitations Quad Stretch Limitations: standing Plyometrics Bilateral Jumping Limitations: sagittal and frontal plane jumps 10x  Other Plyometric Exercises: Agility ladder: forward, side, diagonals, grape vine, in-out, forward lateral, no jumping.  Standing Forward Lunges Limitations: Lunge matrix 5x to floor overhead to knee high reach to drive hamstrings Lateral Step Up Limitations: 3 way step ups 14" 10x Functional Squat: 10 reps;Limitations Functional Squat Limitations: single leg toe touch, Squat reach matrix with 5lb dumbbell 5x SLS with Vectors: 6" box Single leg balance reach matrix common and uncommon 5x Other  Standing Knee Exercises: Walking single leg deadlift with yellow ball.  Other Standing Knee Exercises: Single leg squat Depth:     Physical Therapy Assessment and Plan PT Assessment and Plan Clinical Impression Statement: [place] 12 per ACl protocol, 12th week calls for initiation of plyometrics, but only bilateral plyometrics performed this session due to still limited knee control secondary to decreased strength/endurance (thoguh strength is much improved). 110 degrees single leg squat depth, though endurance is still limited. Focus to remain of proressing single leg squat depth/endurance. No pain throughotu session just fatigue PT Plan:  Prpgress per ACL protocol for week 12 next session, Progress  jumping to jump matrix next session, Progress step ups to 18" box, Single leg balance reach matrix to on airex pad, Introduce speed warm-up next session    Goals PT Long Term Goals Long Term Goal 4: patient will be able to perform Single leg squat to depth of >100 degrees of knee flexion and single leg squat endurance test to 18" box >15x in one minute indicatign patient is ready for return to sport testing Long Term Goal 4 Progress: Progressing toward goal PT Long Term Goal 5: Patient Will be able to perform 3x single leg hop within 5% of uninvolved side to demsontrate equal strenght and power bilaterally and patient is ready to return to sport.  Long Term Goal 5 Progress: Progressing toward goal  Problem List Patient Active Problem List   Diagnosis Date Noted  . Stiffness of joint, not elsewhere classified, lower leg 11/08/2013  . Difficulty in walking(719.7) 11/08/2013  . Pain in joint, lower leg 11/08/2013  . Menorrhagia 09/07/2013  . Hypertension 10/13/2012  . Depression with anxiety 10/13/2012       GP    Burdell Peed R 01/19/2014, 8:43 AM

## 2014-01-24 ENCOUNTER — Ambulatory Visit (HOSPITAL_COMMUNITY): Payer: BC Managed Care – PPO

## 2014-01-25 ENCOUNTER — Ambulatory Visit (HOSPITAL_COMMUNITY): Payer: BC Managed Care – PPO

## 2014-02-01 ENCOUNTER — Ambulatory Visit (HOSPITAL_COMMUNITY)
Admission: RE | Admit: 2014-02-01 | Discharge: 2014-02-01 | Disposition: A | Discharge status: Home / Self Care | Payer: BC Managed Care – PPO | Source: Ambulatory Visit | Attending: Family Medicine | Admitting: Family Medicine

## 2014-02-01 DIAGNOSIS — Z9889 Other specified postprocedural states: Secondary | ICD-10-CM | POA: Diagnosis present

## 2014-02-01 DIAGNOSIS — M25569 Pain in unspecified knee: Secondary | ICD-10-CM | POA: Diagnosis not present

## 2014-02-01 DIAGNOSIS — M25669 Stiffness of unspecified knee, not elsewhere classified: Secondary | ICD-10-CM | POA: Insufficient documentation

## 2014-02-01 DIAGNOSIS — I10 Essential (primary) hypertension: Secondary | ICD-10-CM | POA: Insufficient documentation

## 2014-02-01 DIAGNOSIS — R262 Difficulty in walking, not elsewhere classified: Secondary | ICD-10-CM | POA: Diagnosis not present

## 2014-02-01 DIAGNOSIS — M6281 Muscle weakness (generalized): Secondary | ICD-10-CM | POA: Diagnosis not present

## 2014-02-01 DIAGNOSIS — IMO0001 Reserved for inherently not codable concepts without codable children: Secondary | ICD-10-CM | POA: Diagnosis not present

## 2014-02-01 NOTE — Progress Notes (Addendum)
Physical Therapy Treatment Patient Details  Name: Eileen Nunez MRN: [id number] Date of Birth: 07-28-1980  Today's Date: 02/01/2014 Time: 5366-4403 PT Time Calculation (min): 42 min   Charges: TherEx [id number] Visit#: 21 of 24  Re-eval: 02/09/14 Assessment Diagnosis: Difficuty walking s/p ACL reconstruction  Surgical Date: 10/24/13 Next MD Visit: Orie Fisherman 01/18/2014  Authorization: BCBS  Authorization Visit#: 21 of 24   Subjective: Symptoms/Limitations Symptoms: Patient notes she hasbeen feeling good, a little bit of pain in Rt Lateral knee with single leg squatting.  Pain Assessment Currently in Pain?: Yes Pain Score: 2  Pain Location: Knee Pain Orientation: Right;Lateral  Precautions/Restrictions  Precautions Precautions: Other (comment) Precaution Comments: ACL protocol: 1-3 weeks (10/15/2013): squat 0-40 degrees, leg press, heel/toe raises, AROM 30-100 degrees;  3-4 weeks: 4" step downs, wall sits, sport cord, AROM 0-130; 6-12 weeks AROM WNL rebounder balancing SLS, lunges at 7 weeks, isometric wall sits 90degrees at week 6, agiility exercises (fast feet, front and side controlled hopping Bil LE; Week 12-16 Full strength once quad 70% on the involvted LE begin TM running and plyometric (jump rope, figure 8, shuttle); 16 weeks 70% strength correlation must be acheived with 80% agility correlation  Exercise/Treatments Stretches Active Hamstring Stretch Limitations: 14" 3 way hamstring stretch 10x 3seconds Quad Stretch: 3 reps;30 seconds;Limitations Quad Stretch Limitations: standing 3way calf stretch 10x 3seconds Plyometrics Bilateral Jumping Limitations: Jump matrix on floor 10x Other Plyometric Exercises: Speed warm-up Standing Forward Lunges Limitations: Lunge matrix 5x to floor overhead to knee high reach to drive hamstrings SLS with Vectors: Single leg balance reach matrix common and uncommon on airex5x Other Standing Knee Exercises: Walking single leg deadlift with Blue  ball.   Physical Therapy Assessment and Plan PT Assessment and Plan Clinical Impression Statement: Week 14 Per ACL protocol: continued jumping and introduced speed warmp up exercises. Patient displays limited single leg squat ndurance on Rt LE vs Lt LE with still limited though improving depth indicating patient is ready for low level jumping exercises, but not ready for hopping and return to sport training yet.  Patient demosntrated good tolerance of all exercises and only minot pain during single leg squatt endurance testing.  PT Plan:  Prpgress per ACL protocol for week 12 next session, Continue current exercise intensity until single leg squat endurance/depth improves. Replace squat matrix next session with single leg squat with retro 3way reach from 4" box, Add step up with heel raise to 14-16" box   Goals PT Long Term Goals Long Term Goal 3: Patient will display knee flexion strength of 5/5 in preparation for jumping, and cutting exercises for patient to return to sport Long Term Goal 3 Progress: Progressing toward goal Long Term Goal 4: patient will be able to perform Single leg squat to depth of >100 degrees of knee flexion and single leg squat endurance test to 18" box >15x in one minute indicatign patient is ready for return to sport testing Long Term Goal 4 Progress: Progressing toward goal PT Long Term Goal 5: Patient Will be able to perform 3x single leg hop within 5% of uninvolved side to demsontrate equal strenght and power bilaterally and patient is ready to return to sport.  Long Term Goal 5 Progress: Progressing toward goal  Problem List Patient Active Problem List   Diagnosis Date Noted  . Stiffness of joint, not elsewhere classified, lower leg 11/08/2013  . Difficulty in walking(719.7) 11/08/2013  . Pain in joint, lower leg 11/08/2013  . Menorrhagia 09/07/2013  . Hypertension 10/13/2012  .  Depression with anxiety 10/13/2012    PT - End of Session Activity Tolerance:  Patient tolerated treatment well General Behavior During Therapy: [facility] for tasks assessed/performed  GP    Syvanna Ciolino R 02/01/2014, 8:44 AM

## 2014-02-03 ENCOUNTER — Inpatient Hospital Stay (HOSPITAL_COMMUNITY)
Admission: RE | Admit: 2014-02-03 | Discharge: 2014-02-03 | Disposition: A | Discharge status: Home / Self Care | Payer: BC Managed Care – PPO | Source: Ambulatory Visit | Attending: Physical Therapy | Admitting: Physical Therapy

## 2014-02-07 ENCOUNTER — Inpatient Hospital Stay (HOSPITAL_COMMUNITY)
Admission: RE | Admit: 2014-02-07 | Payer: BC Managed Care – PPO | Source: Ambulatory Visit | Admitting: Physical Therapy

## 2014-02-09 ENCOUNTER — Ambulatory Visit (HOSPITAL_COMMUNITY)
Admission: RE | Admit: 2014-02-09 | Discharge: 2014-02-09 | Disposition: A | Discharge status: Home / Self Care | Payer: BC Managed Care – PPO | Source: Ambulatory Visit | Attending: Family Medicine | Admitting: Family Medicine

## 2014-02-09 DIAGNOSIS — IMO0001 Reserved for inherently not codable concepts without codable children: Secondary | ICD-10-CM | POA: Diagnosis not present

## 2014-02-09 NOTE — Evaluation (Addendum)
Physical Therapy Reassessment  Patient Details  Name: Eileen Nunez MRN: [id number] Date of Birth: 07/21/1980  Today's Date: 02/09/2014 Time: 0803-0845 PT Time Calculation (min): 42 min     Charges: therEx [id number]         Visit#: 22 of 24  Re-eval: 03/11/14 Assessment Diagnosis: Difficuty walking s/p ACL reconstruction  Surgical Date: 10/24/13 Next MD Visit: Orie Fisherman 01/18/2014  Authorization: BCBS    Authorization Time Period:    Authorization Visit#: 22 of 24   Past Medical History:  Past Medical History  Diagnosis Date  . Hypertension     during pregnancy  . Diabetes mellitus without complication     gestational diabetes  . Menorrhagia 09/07/2013    Has period about every 3 weeks and has cramp too  . Anxiety    Past Surgical History:  Past Surgical History  Procedure Laterality Date  . Cesarean section      x 2  . Tubal ligation    . Dilitation & currettage/hystroscopy with thermachoice ablation N/A 09/20/2013    Procedure: DILATATION & CURETTAGE/HYSTEROSCOPY WITH THERMACHOICE ABLATION;  Surgeon: [name], MD;  Location: [facility];  Service: Gynecology;  Laterality: N/A;  total therapy time: 9 minutes 30 seconds;  temperature:  87 degrees     Subjective Symptoms/Limitations Symptoms: Patient notes that she has began running noting she felt good when runing less than a mile of run walk, patient notes contineud mild anterior knee pain following running. Notes non-painful popping durng light agility work Pain Assessment Currently in Pain?: No/denies  Precautions/Restrictions  Precautions Precautions: Other (comment) Precaution Comments: ACL protocol: 1-3 weeks (10/15/2013): squat 0-40 degrees, leg press, heel/toe raises, AROM 30-100 degrees;  3-4 weeks: 4" step downs, wall sits, sport cord, AROM 0-130; 6-12 weeks AROM WNL rebounder balancing SLS, lunges at 7 weeks, isometric wall sits 90degrees at week 6, agiility exercises (fast feet, front and side controlled  hopping Bil LE; Week 12-16 Full strength once quad 70% on the involvted LE begin TM running and plyometric (jump rope, figure 8, shuttle); 16 weeks 70% strength correlation must be acheived with 80% agility correlation  Sensation/Coordination/Flexibility/Functional Tests Flexibility Thomas: Positive Functional Tests Functional Tests: single leg squat endurance test: 16 Rt, 20 Lt, requires toe touch occasionally with Rt single leg squatr Functional Tests: Single leg squat depth 102 Rt, 110 Lt  Assessment RLE AROM (degrees) Right Knee Extension: 0 Right Knee Flexion: 132 RLE Strength Right Hip Extension:  (4+/5 was 4/5) Right Hip ABduction:  (4+/5 was 4/5) Right Knee Flexion:  (4+/5 was 4/5) Right Knee Extension: 5/5 Right Ankle Dorsiflexion: 5/5  Exercise/Treatments Stretches Active Hamstring Stretch Limitations: 14" 3 way hamstring stretch 10x 3seconds Quad stretch 20sec 5x Plyometrics Bilateral Jumping Limitations: Jump matrix on floor 5x Other Plyometric Exercises: lunge hop matrix Other Plyometric Exercises: Agility ladder: forward, side, diagonals, grape vine, in-out, forward lateral, no jumping.  Standing Forward Lunges Limitations: Lunge matrix 5x to common and uncommon Lateral Step Up Limitations: 3 way step ups with heel rise 14" 10x Functional Squat Limitations: single leg toe touch to 8" retro, Squat reach matrix with 5lb dumbbell 5x SLS with Vectors: Single leg balance reach matrix common and uncommon on 6" 5x  Physical Therapy Assessment and Plan PT Assessment and Plan Clinical Impression Statement: Week 15 Per ACL Protocol: patient demosntrates improving strength and ROM and will benefit from further phsyical therapy to further progress knee mobility and strength so patient can safely return to playing volleyball and running on  trails without pain. Patient was advised to begin Zumba class.  patient advised to contine running on flatt even surfaces.  PT Plan:   Progress per ACL protocol for week 16 next session, Continue current exercise intensity until single leg squat endurance/depth improves. Replace squat matrix next session with single leg squat reach matrix from 4" box, add uncommon lunge matrix, Add box jumps next session with emphasis on controlling descent through depth.   Continueing PT 1x a week for 2 more weeks  Goals PT Short Term Goals PT Short Term Goal 1: Patient will be able to fully extend knee to Normalize gait PT Short Term Goal 1 - Progress: Progressing toward goal PT Short Term Goal 2: Patient will demosntrated Rt quadraceps strength of 4/5 to be able to perform sit to stand without UE support PT Short Term Goal 2 - Progress: Progressing toward goal PT Short Term Goal 3: Patient will be able to flex knee to greater than 110 degrees to be able to perform stand t sit withotu UE support.  PT Short Term Goal 3 - Progress: Progressing toward goal PT Short Term Goal 4: Patient will be able to tolerate standing for >22minutes with pain <2/10 so patient can comfortably cook meals.  PT Short Term Goal 4 - Progress: Progressing toward goal PT Short Term Goal 5: Patient will be able to tolerate  walking >29minutes with pain <2/10 so patient can buy groceries. PT Short Term Goal 5 - Progress: Progressing toward goal PT Long Term Goals PT Long Term Goal 1: Patient will display full knee ROM (0-125 or greater) to fully normalize gait PT Long Term Goal 1 - Progress: Progressing toward goal PT Long Term Goal 2: Patient will display knee extension strength of 5/5 so be able to perform stair ambulation PT Long Term Goal 2 - Progress: Progressing toward goal Long Term Goal 3: Patient will display knee flexion strength of 5/5 in preparation for jumping, and cutting exercises for patient to return to sport Long Term Goal 3 Progress: Progressing toward goal Long Term Goal 4: patient will be able to perform Single leg squat to depth of >100 degrees of  knee flexion and single leg squat endurance test to 18" box >15x in one minute indicatign patient is ready for return to sport testing Long Term Goal 4 Progress: Partly met PT Long Term Goal 5: Patient Will be able to perform 3x single leg hop within 5% of uninvolved side to demsontrate equal strenght and power bilaterally and patient is ready to return to sport.  Long Term Goal 5 Progress: Progressing toward goal  Problem List Patient Active Problem List   Diagnosis Date Noted  . Stiffness of joint, not elsewhere classified, lower leg 11/08/2013  . Difficulty in walking(719.7) 11/08/2013  . Pain in joint, lower leg 11/08/2013  . Menorrhagia 09/07/2013  . Hypertension 10/13/2012  . Depression with anxiety 10/13/2012    PT - End of Session Activity Tolerance: Patient tolerated treatment well General Behavior During Therapy: [facility] for tasks assessed/performed  GP    Tryson Lumley R 02/09/2014, 8:45 AM  Physician Documentation Your signature is required to indicate approval of the treatment plan as stated above.  Please sign and either send electronically or make a copy of this report for your files and return this physician signed original.   Please mark one 1.__approve of plan  2. ___approve of plan with the following conditions.   ______________________________  _____________________ Physician Signature                                                                                                             Date

## 2014-02-22 ENCOUNTER — Other Ambulatory Visit: Payer: Self-pay | Admitting: Family Medicine

## 2014-03-09 ENCOUNTER — Encounter: Payer: Self-pay | Admitting: Nurse Practitioner

## 2014-03-09 ENCOUNTER — Ambulatory Visit (INDEPENDENT_AMBULATORY_CARE_PROVIDER_SITE_OTHER): Payer: BC Managed Care – PPO | Admitting: Nurse Practitioner

## 2014-03-09 VITALS — BP 128/82 | Ht 70.0 in | Wt 204.0 lb

## 2014-03-09 DIAGNOSIS — E669 Obesity, unspecified: Secondary | ICD-10-CM

## 2014-03-09 DIAGNOSIS — F418 Other specified anxiety disorders: Secondary | ICD-10-CM

## 2014-03-09 DIAGNOSIS — F341 Dysthymic disorder: Secondary | ICD-10-CM

## 2014-03-09 DIAGNOSIS — I10 Essential (primary) hypertension: Secondary | ICD-10-CM

## 2014-03-09 MED ORDER — PHENTERMINE HCL 37.5 MG PO TABS: 37.5000 mg | ORAL_TABLET | Freq: Every day | ORAL | Status: DC

## 2014-03-09 NOTE — Progress Notes (Signed)
Subjective:  Presents for routine follow up. Back with her previous employer; going well. Husband has very serious health issues which causes stress. Overall doing well on buproprion. Would like to restart weight loss medication. Qsymia too expensive. Has taken phentermine with buprorpion without difficulty in the past. Had recent knee surgery. Just starting back activity. Gets regular preventive health physicals with GYN. Gets flu vaccine at work.   Objective:   BP 128/82  Ht  (1.778 m)  Wt 204 lb (92.534 kg)  BMI 29.27 kg/m2 NAD. Alert, oriented. Lungs clear. Heart RRR.   Assessment:  Problem List Items Addressed This Visit     Cardiovascular and Mediastinum   Hypertension     Other   Depression with anxiety - Primary    Other Visit Diagnoses   Obesity        Relevant Medications       phentermine (ADIPEX-P) 37.5 MG tablet      Plan:  Meds ordered this encounter  Medications  . phentermine (ADIPEX-P) 37.5 MG tablet    Sig: Take 1 tablet (37.5 mg total) by mouth daily before breakfast.    Dispense:  30 tablet    Refill:  2    Order Specific Question:  Supervising Provider    Answer:  Merlyn Albert [[id number]]   Continue other meds as directed  Follow up in 6 months, sooner if needed.

## 2014-04-10 ENCOUNTER — Other Ambulatory Visit: Payer: Self-pay | Admitting: Nurse Practitioner

## 2014-04-24 ENCOUNTER — Encounter: Payer: Self-pay | Admitting: Nurse Practitioner

## 2014-08-18 ENCOUNTER — Other Ambulatory Visit: Payer: Self-pay | Admitting: Nurse Practitioner

## 2014-08-21 ENCOUNTER — Other Ambulatory Visit: Payer: Self-pay | Admitting: Adult Health

## 2014-09-18 ENCOUNTER — Encounter: Payer: Self-pay | Admitting: Adult Health

## 2014-09-18 ENCOUNTER — Ambulatory Visit (INDEPENDENT_AMBULATORY_CARE_PROVIDER_SITE_OTHER): Payer: BLUE CROSS/BLUE SHIELD | Admitting: Adult Health

## 2014-09-18 VITALS — BP 120/72 | HR 84 | Ht 69.25 in | Wt 203.5 lb

## 2014-09-18 DIAGNOSIS — Z01419 Encounter for gynecological examination (general) (routine) without abnormal findings: Secondary | ICD-10-CM | POA: Diagnosis not present

## 2014-09-18 NOTE — Patient Instructions (Signed)
Pap and physical in 1 year Mammogram at 38   

## 2014-09-18 NOTE — Progress Notes (Signed)
Patient ID: Eileen Nunez, female   DOB: 07-Sep-1980, 34 y.o.   MRN: [id number] History of Present Illness: Eileen Nunez is a 34 year old white female, married in for well woman gyn exam.She had a normal pap with negative HPV 07/29/12. Sees Sherie Donarolyn Hoskins NP as PCP.   Current Medications, Allergies, Past Medical History, Past Surgical History, Family History and Social History were reviewed in [facility] electronic medical record.     Review of Systems: Patient denies any headaches, hearing loss, fatigue, blurred vision, shortness of breath, chest pain, abdominal pain, problems with bowel movements, urination, or intercourse. No joint pain or mood swings(takes Wellbutrin).Husband awaiting liver and bowel transplant.She is sp ablation for menorrhagia and just spots for a day or 2 can even go without a pad.    Physical Exam:BP 120/72 mmHg  Pulse 84  Ht 5' 9.25" (1.759 m)  Wt 203 lb 8 oz (92.307 kg)  BMI 29.83 kg/m2  LMP 09/04/2014 General:  Well developed, well nourished, no acute distress Skin:  Warm and dry Neck:  Midline trachea, normal thyroid, good ROM, no lymphadenopathy Lungs; Clear to auscultation bilaterally Breast:  No dominant palpable mass, retraction, or nipple discharge Cardiovascular: Regular rate and rhythm Abdomen:  Soft, non tender, no hepatosplenomegaly Pelvic:  External genitalia is normal in appearance, no lesions.  The vagina is normal in appearance, with good color, moisture and rugae. Urethra has no lesions or masses. The cervix is bulbous.  Uterus is felt to be normal size, shape, and contour.  No adnexal masses or tenderness noted.Bladder is non tender, no masses felt. Extremities/musculoskeletal:  No swelling or varicosities noted, no clubbing or cyanosis Psych:  No mood changes, alert and cooperative,seems happy   Impression: Well woman gyn exam no pap    Plan: Pap and physical in 1 year Mammogram yearly

## 2014-09-19 ENCOUNTER — Encounter: Payer: Self-pay | Admitting: Nurse Practitioner

## 2014-09-19 ENCOUNTER — Ambulatory Visit (INDEPENDENT_AMBULATORY_CARE_PROVIDER_SITE_OTHER): Payer: BLUE CROSS/BLUE SHIELD | Admitting: Nurse Practitioner

## 2014-09-19 VITALS — BP 106/78 | Ht 70.0 in | Wt 204.0 lb

## 2014-09-19 DIAGNOSIS — I10 Essential (primary) hypertension: Secondary | ICD-10-CM

## 2014-09-19 DIAGNOSIS — F418 Other specified anxiety disorders: Secondary | ICD-10-CM

## 2014-09-19 DIAGNOSIS — R5383 Other fatigue: Secondary | ICD-10-CM | POA: Diagnosis not present

## 2014-09-19 MED ORDER — TRIAMTERENE-HCTZ 75-50 MG PO TABS: ORAL_TABLET | ORAL | Status: DC

## 2014-09-19 MED ORDER — BUPROPION HCL ER (XL) 300 MG PO TB24: 300.0000 mg | ORAL_TABLET | Freq: Every day | ORAL | Status: DC

## 2014-09-20 ENCOUNTER — Encounter: Payer: Self-pay | Admitting: Nurse Practitioner

## 2014-09-20 NOTE — Progress Notes (Signed)
Subjective:  Presents for routine follow up. Had her physical yesterday. Regular exercise 2-3 days per week. Continues to be under tremendous personal stress due to her husband's illness. Energy level "pretty good".   Objective:   BP 106/78 mmHg  Ht 5\' 10"  (1.778 m)  Wt 204 lb (92.534 kg)  BMI 29.27 kg/m2  LMP 09/04/2014 NAD. alert, oriented. Cheerful affect. Lungs clear. Heart regular rate rhythm. Lower extremities no edema.  Assessment:  Problem List Items Addressed This Visit      Cardiovascular and Mediastinum   Hypertension - Primary   Relevant Medications   triamterene-hydrochlorothiazide (MAXZIDE) 75-50 MG per tablet   Other Relevant Orders   Lipid panel   Hepatic function panel   Basic metabolic panel     Other   Depression with anxiety    Other Visit Diagnoses    Other fatigue        Relevant Orders    Hepatic function panel    Basic metabolic panel    TSH    Vit D  25 hydroxy (rtn osteoporosis monitoring)      Plan:  Meds ordered this encounter  Medications  . buPROPion (WELLBUTRIN XL) 300 MG 24 hr tablet    Sig: Take 1 tablet (300 mg total) by mouth daily.    Dispense:  30 tablet    Refill:  5    Order Specific Question:  Supervising Provider    Answer:  Merlyn AlbertLUKING, WILLIAM S [[id number]]  . triamterene-hydrochlorothiazide (MAXZIDE) 75-50 MG per tablet    Sig: TAKE ONE-HALF TABLET BY MOUTH EVERY MORNING AS NEEDED    Dispense:  45 tablet    Refill:  5    Order Specific Question:  Supervising Provider    Answer:  Merlyn AlbertLUKING, WILLIAM S [[id number]]   Continue current medications as directed. Encouraged continued activity. Discussed importance of stress reduction. Return in about 6 months (around 03/22/2015) for recheck. Call back sooner if any problems.

## 2014-10-10 ENCOUNTER — Ambulatory Visit (INDEPENDENT_AMBULATORY_CARE_PROVIDER_SITE_OTHER): Payer: BLUE CROSS/BLUE SHIELD | Admitting: Nurse Practitioner

## 2014-10-10 ENCOUNTER — Encounter: Payer: Self-pay | Admitting: Nurse Practitioner

## 2014-10-10 VITALS — BP 132/86 | Temp 99.9°F | Ht 70.0 in | Wt 205.2 lb

## 2014-10-10 DIAGNOSIS — R319 Hematuria, unspecified: Secondary | ICD-10-CM

## 2014-10-10 DIAGNOSIS — N390 Urinary tract infection, site not specified: Secondary | ICD-10-CM

## 2014-10-10 DIAGNOSIS — R3915 Urgency of urination: Secondary | ICD-10-CM

## 2014-10-10 DIAGNOSIS — R509 Fever, unspecified: Secondary | ICD-10-CM

## 2014-10-10 LAB — POCT URINALYSIS DIPSTICK
PH UA: 6
SPEC GRAV UA: 1.015

## 2014-10-10 MED ORDER — CIPROFLOXACIN HCL 500 MG PO TABS: 500.0000 mg | ORAL_TABLET | Freq: Two times a day (BID) | ORAL | Status: DC

## 2014-10-11 ENCOUNTER — Encounter: Payer: Self-pay | Admitting: Nurse Practitioner

## 2014-10-11 LAB — POCT UA - MICROSCOPIC ONLY: Bacteria, U Microscopic: POSITIVE

## 2014-10-11 NOTE — Progress Notes (Signed)
Subjective:  Presents for c/o chills, fever and aches that began yesterday. Temp 101.5 earlier today. Took one dose of Cipro this am that she had at home. No change in urinary urgency. No dysuria. No pelvic pain. Mild right low back pain after doing yard work. No CVA tenderness. Nausea x 1; no vomiting or diarrhea. Taking fluids well. Normal appetite.   Objective:   BP 132/86 mmHg  Temp(Src) 99.9 F (37.7 C) (Oral)  Ht 5\' 10"  (1.778 m)  Wt 205 lb 3.2 oz (93.078 kg)  BMI 29.44 kg/m2  LMP 10/03/2014 NAD. Alert, oriented. TMs mild clear effusion. Pharynx clear. Neck supple with mild adenopathy. Lungs clear. Heart RRR. Very mild left CVA tenderness; none on right. Minimal tenderness with palpation right lumbar area. Abdomen soft, non distended with active BS; non tender. Results for orders placed or performed in visit on 10/10/14  POCT urinalysis dipstick  Result Value Ref Range   Color, UA     Clarity, UA     Glucose, UA     Bilirubin, UA     Ketones, UA     Spec Grav, UA 1.015    Blood, UA small    pH, UA 6.0    Protein, UA     Urobilinogen, UA     Nitrite, UA     Leukocytes, UA large (3+)   POCT UA - Microscopic Only  Result Value Ref Range   WBC, Ur, HPF, POC TNTC    RBC, urine, microscopic 5+    Bacteria, U Microscopic pos    Mucus, UA     Epithelial cells, urine per micros rare    Crystals, Ur, HPF, POC     Casts, Ur, LPF, POC     Yeast, UA       Assessment: Febrile illness - Plan: POCT urinalysis dipstick, Urine culture  Urinary urgency - Plan: POCT urinalysis dipstick, Urine culture, POCT UA - Microscopic Only  Urinary tract infection with hematuria, site unspecified - Plan: POCT UA - Microscopic Only  Plan:  Meds ordered this encounter  Medications  . ciprofloxacin (CIPRO) 500 MG tablet    Sig: Take 1 tablet (500 mg total) by mouth 2 (two) times daily.    Dispense:  14 tablet    Refill:  0    Order Specific Question:  Supervising Provider    Answer:  Merlyn AlbertLUKING,  WILLIAM S [[id number]]   Warning signs reviewed. Call back in 72 hours if no improvement, sooner if worse.

## 2014-10-12 LAB — URINE CULTURE: Organism ID, Bacteria: NO GROWTH

## 2014-11-07 ENCOUNTER — Encounter: Payer: Self-pay | Admitting: Nurse Practitioner

## 2014-11-09 ENCOUNTER — Other Ambulatory Visit: Payer: Self-pay | Admitting: Nurse Practitioner

## 2014-11-09 MED ORDER — PHENTERMINE HCL 37.5 MG PO TABS: 37.5000 mg | ORAL_TABLET | Freq: Every day | ORAL | Status: DC

## 2015-03-23 ENCOUNTER — Encounter: Payer: Self-pay | Admitting: Nurse Practitioner

## 2015-03-23 NOTE — Telephone Encounter (Signed)
Patient last seen 10/11/14

## 2015-04-13 ENCOUNTER — Encounter: Payer: Self-pay | Admitting: Nurse Practitioner

## 2015-04-17 ENCOUNTER — Other Ambulatory Visit: Payer: Self-pay | Admitting: Nurse Practitioner

## 2015-04-17 MED ORDER — PHENTERMINE HCL 37.5 MG PO TABS: 37.5000 mg | ORAL_TABLET | Freq: Every day | ORAL | Status: DC

## 2015-04-18 ENCOUNTER — Other Ambulatory Visit: Payer: Self-pay | Admitting: Nurse Practitioner

## 2015-05-24 ENCOUNTER — Other Ambulatory Visit: Payer: Self-pay | Admitting: Family Medicine

## 2015-05-24 NOTE — Telephone Encounter (Signed)
15 tablets send her a  card

## 2015-06-13 ENCOUNTER — Ambulatory Visit (INDEPENDENT_AMBULATORY_CARE_PROVIDER_SITE_OTHER): Payer: BLUE CROSS/BLUE SHIELD | Admitting: Nurse Practitioner

## 2015-06-13 ENCOUNTER — Encounter: Payer: Self-pay | Admitting: Nurse Practitioner

## 2015-06-13 VITALS — BP 120/84 | Ht 70.0 in | Wt 202.2 lb

## 2015-06-13 DIAGNOSIS — F418 Other specified anxiety disorders: Secondary | ICD-10-CM

## 2015-06-13 DIAGNOSIS — I10 Essential (primary) hypertension: Secondary | ICD-10-CM | POA: Diagnosis not present

## 2015-06-13 MED ORDER — BUPROPION HCL ER (XL) 300 MG PO TB24: ORAL_TABLET | ORAL | Status: DC

## 2015-06-13 MED ORDER — TRIAMTERENE-HCTZ 75-50 MG PO TABS: ORAL_TABLET | ORAL | Status: DC

## 2015-06-15 ENCOUNTER — Encounter: Payer: Self-pay | Admitting: Nurse Practitioner

## 2015-06-15 NOTE — Progress Notes (Signed)
Subjective:  Presents for recheck. Was off her Wellbutrin for about a week, noticed a big difference in her depression. Would like to restart. Her husband who had been sick for a long time, recently passed away. Patient states she is doing well at this point. No chest pain/ischemic type pain or shortness of breath. No edema.   Objective:   BP 120/84 mmHg  Ht 5\' 10"  (1.778 m)  Wt 202 lb 4 oz (91.74 kg)  BMI 29.02 kg/m2 NAD. Alert, oriented. Thoughts logical coherent and relevant. Calm affect. Lungs clear. Heart regular rate rhythm. Lower extremities no edema.  Assessment:  Problem List Items Addressed This Visit      Cardiovascular and Mediastinum   Hypertension - Primary   Relevant Medications   triamterene-hydrochlorothiazide (MAXZIDE) 75-50 MG tablet     Other   Depression with anxiety       Plan:  Meds ordered this encounter  Medications  . buPROPion (WELLBUTRIN XL) 300 MG 24 hr tablet    Sig: TAKE ONE TABLET BY MOUTH ONCE DAILY    Dispense:  30 tablet    Refill:  5    Order Specific Question:  Supervising Provider    Answer:  Merlyn AlbertLUKING, WILLIAM S [[id number]]  . triamterene-hydrochlorothiazide (MAXZIDE) 75-50 MG tablet    Sig: TAKE ONE-HALF TABLET BY MOUTH EVERY MORNING AS NEEDED    Dispense:  45 tablet    Refill:  1    Order Specific Question:  Supervising Provider    Answer:  Merlyn AlbertLUKING, WILLIAM S [[id number]]   Return in about 6 months (around 12/12/2015) for physical. And lab work.

## 2015-08-01 ENCOUNTER — Encounter: Payer: Self-pay | Admitting: Nurse Practitioner

## 2015-08-01 ENCOUNTER — Other Ambulatory Visit: Payer: Self-pay | Admitting: Nurse Practitioner

## 2015-08-01 MED ORDER — PHENTERMINE-TOPIRAMATE ER 3.75-23 MG PO CP24: ORAL_CAPSULE | ORAL | Status: DC

## 2015-08-01 MED ORDER — PHENTERMINE-TOPIRAMATE ER 7.5-46 MG PO CP24: ORAL_CAPSULE | ORAL | Status: DC

## 2015-10-16 ENCOUNTER — Other Ambulatory Visit: Payer: Self-pay | Admitting: Nurse Practitioner

## 2015-11-15 ENCOUNTER — Ambulatory Visit: Payer: Self-pay | Admitting: Nurse Practitioner

## 2015-11-29 ENCOUNTER — Other Ambulatory Visit: Payer: Self-pay | Admitting: Nurse Practitioner

## 2015-12-12 ENCOUNTER — Encounter: Payer: Self-pay | Admitting: Nurse Practitioner

## 2015-12-12 ENCOUNTER — Ambulatory Visit (INDEPENDENT_AMBULATORY_CARE_PROVIDER_SITE_OTHER): Payer: BLUE CROSS/BLUE SHIELD | Admitting: Nurse Practitioner

## 2015-12-12 VITALS — BP 122/82 | Ht 70.0 in | Wt 205.2 lb

## 2015-12-12 DIAGNOSIS — I10 Essential (primary) hypertension: Secondary | ICD-10-CM

## 2015-12-12 DIAGNOSIS — E669 Obesity, unspecified: Secondary | ICD-10-CM | POA: Diagnosis not present

## 2015-12-12 DIAGNOSIS — R5383 Other fatigue: Secondary | ICD-10-CM | POA: Diagnosis not present

## 2015-12-12 DIAGNOSIS — F418 Other specified anxiety disorders: Secondary | ICD-10-CM | POA: Diagnosis not present

## 2015-12-12 DIAGNOSIS — Z139 Encounter for screening, unspecified: Secondary | ICD-10-CM | POA: Diagnosis not present

## 2015-12-12 MED ORDER — PHENTERMINE-TOPIRAMATE ER 11.25-69 MG PO CP24: ORAL_CAPSULE | ORAL | Status: DC

## 2015-12-12 NOTE — Progress Notes (Signed)
Subjective:  Presents for routine follow-up. Has been off Qsymia for few months. Would like to restart at a slightly higher dose. Taking her fluid pill about 4-5 times per week for mild peripheral edema which is working well. Mainly associated with hot weather. Denies any side effects from qsymia. Doing well on bupropion. Lost her husband and father over the past 1-2 years. Has been doing well lately. Also concerned about frequent flushing of the upper chest and neck area. Has had endometrial ablation still has light cycles. Gets her physicals done at [facility]. ROS otherwise negative.  Objective:   BP 122/82 mmHg  Ht 5\' 10"  (1.778 m)  Wt 205 lb 3.2 oz (93.078 kg)  BMI 29.44 kg/m2 NAD. Alert, oriented. Lungs clear. Heart regular rate rhythm. Lower extremities no edema. Patient has a light tan in general. Moderate flushing noted on the upper chest and anterior neck area. Blanches easily.  Assessment:  Problem List Items Addressed This Visit      Cardiovascular and Mediastinum   Hypertension - Primary   Relevant Orders   Hepatic function panel   Basic metabolic panel     Other   Depression with anxiety   Obesity   Relevant Medications   Phentermine-Topiramate (QSYMIA) 11.25-69 MG CP24    Other Visit Diagnoses    Other fatigue        Relevant Orders    TSH    VITAMIN D 25 Hydroxy (Vit-D Deficiency, Fractures)    CBC with Differential/Platelet    Screening        Relevant Orders    Lipid panel        Plan: , Meds ordered this encounter  Medications  . Phentermine-Topiramate (QSYMIA) 11.25-69 MG CP24    Sig: One po qd    Dispense:  30 capsule    Refill:  2    Order Specific Question:  Supervising Provider    Answer:  Riccardo DubinLUKING, WILLIAM S [[id number]]   Lab work pending. Recommend follow-up with gynecology to discuss perimenopause and flushing. Restart Qsymia at next dose.  Return in about 6 months (around 06/12/2016) for recheck.

## 2015-12-15 DIAGNOSIS — R5383 Other fatigue: Secondary | ICD-10-CM | POA: Diagnosis not present

## 2015-12-15 DIAGNOSIS — I10 Essential (primary) hypertension: Secondary | ICD-10-CM | POA: Diagnosis not present

## 2015-12-15 DIAGNOSIS — E785 Hyperlipidemia, unspecified: Secondary | ICD-10-CM | POA: Diagnosis not present

## 2015-12-17 LAB — LIPID PANEL
Chol/HDL Ratio: 3.2 ratio units (ref 0.0–4.4)
Cholesterol, Total: 159 mg/dL (ref 100–199)
HDL: 50 mg/dL (ref >39)
LDL CALC: 85 mg/dL (ref 0–99)
Triglycerides: 120 mg/dL (ref 0–149)
VLDL CHOLESTEROL CAL: 24 mg/dL (ref 5–40)

## 2015-12-17 LAB — BASIC METABOLIC PANEL
BUN/Creatinine Ratio: 13 (ref 9–23)
BUN: 16 mg/dL (ref 6–20)
CO2: 20 mmol/L (ref 18–29)
CREATININE: 1.26 mg/dL — AB (ref 0.57–1.00)
Calcium: 8.7 mg/dL (ref 8.7–10.2)
Chloride: 102 mmol/L (ref 96–106)
GFR calc Af Amer: 63 mL/min/{1.73_m2} (ref >59)
GFR calc non Af Amer: 55 mL/min/{1.73_m2} — ABNORMAL LOW (ref >59)
GLUCOSE: 88 mg/dL (ref 65–99)
POTASSIUM: 4.2 mmol/L (ref 3.5–5.2)
SODIUM: 140 mmol/L (ref 134–144)

## 2015-12-17 LAB — CBC WITH DIFFERENTIAL/PLATELET
BASOS: 1 %
Basophils Absolute: 0 10*3/uL (ref 0.0–0.2)
EOS (ABSOLUTE): 0.1 10*3/uL (ref 0.0–0.4)
Eos: 1 %
Hematocrit: 45 % (ref 34.0–46.6)
Hemoglobin: 15.4 g/dL (ref 11.1–15.9)
Immature Grans (Abs): 0 10*3/uL (ref 0.0–0.1)
Immature Granulocytes: 0 %
LYMPHS ABS: 1.8 10*3/uL (ref 0.7–3.1)
Lymphs: 21 %
MCH: 31.8 pg (ref 26.6–33.0)
MCHC: 34.2 g/dL (ref 31.5–35.7)
MCV: 93 fL (ref 79–97)
MONOS ABS: 0.5 10*3/uL (ref 0.1–0.9)
Monocytes: 6 %
NEUTROS ABS: 6.1 10*3/uL (ref 1.4–7.0)
Neutrophils: 71 %
PLATELETS: 211 10*3/uL (ref 150–379)
RBC: 4.84 x10E6/uL (ref 3.77–5.28)
RDW: 14.2 % (ref 12.3–15.4)
WBC: 8.6 10*3/uL (ref 3.4–10.8)

## 2015-12-17 LAB — HEPATIC FUNCTION PANEL
ALK PHOS: 78 IU/L (ref 39–117)
ALT: 19 IU/L (ref 0–32)
AST: 17 IU/L (ref 0–40)
Albumin: 4.4 g/dL (ref 3.5–5.5)
Bilirubin Total: 0.7 mg/dL (ref 0.0–1.2)
Bilirubin, Direct: 0.17 mg/dL (ref 0.00–0.40)
TOTAL PROTEIN: 7 g/dL (ref 6.0–8.5)

## 2015-12-17 LAB — VITAMIN D 25 HYDROXY (VIT D DEFICIENCY, FRACTURES): Vit D, 25-Hydroxy: 39.7 ng/mL (ref 30.0–100.0)

## 2015-12-17 LAB — TSH: TSH: 4.83 u[IU]/mL — AB (ref 0.450–4.500)

## 2016-01-19 ENCOUNTER — Other Ambulatory Visit: Payer: Self-pay | Admitting: Nurse Practitioner

## 2016-03-19 DIAGNOSIS — Z23 Encounter for immunization: Secondary | ICD-10-CM | POA: Diagnosis not present

## 2016-03-26 ENCOUNTER — Other Ambulatory Visit: Payer: Self-pay | Admitting: Nurse Practitioner

## 2016-05-05 ENCOUNTER — Other Ambulatory Visit: Payer: Self-pay | Admitting: Nurse Practitioner

## 2016-05-29 ENCOUNTER — Ambulatory Visit: Payer: BLUE CROSS/BLUE SHIELD | Admitting: Family Medicine

## 2016-06-05 ENCOUNTER — Encounter: Payer: Self-pay | Admitting: Nurse Practitioner

## 2016-06-05 ENCOUNTER — Other Ambulatory Visit: Payer: Self-pay | Admitting: Nurse Practitioner

## 2016-06-05 MED ORDER — PHENTERMINE HCL 37.5 MG PO TABS: 37.5000 mg | ORAL_TABLET | Freq: Every day | ORAL | 2 refills | Status: DC

## 2016-06-06 NOTE — Telephone Encounter (Signed)
Patient due for office visit to recheck medication in Jan 2018.

## 2016-09-05 ENCOUNTER — Other Ambulatory Visit: Payer: Self-pay | Admitting: *Deleted

## 2016-09-05 MED ORDER — BUPROPION HCL ER (XL) 300 MG PO TB24: 300.0000 mg | ORAL_TABLET | Freq: Every day | ORAL | 0 refills | Status: DC

## 2016-10-16 ENCOUNTER — Other Ambulatory Visit: Payer: Self-pay | Admitting: *Deleted

## 2016-10-17 ENCOUNTER — Other Ambulatory Visit: Payer: Self-pay

## 2016-10-17 ENCOUNTER — Other Ambulatory Visit: Payer: Self-pay | Admitting: Nurse Practitioner

## 2016-10-17 MED ORDER — BUPROPION HCL ER (XL) 300 MG PO TB24: 300.0000 mg | ORAL_TABLET | Freq: Every day | ORAL | 0 refills | Status: DC

## 2016-10-17 NOTE — Progress Notes (Signed)
ordered

## 2016-10-30 ENCOUNTER — Other Ambulatory Visit: Payer: Self-pay | Admitting: Nurse Practitioner

## 2016-11-16 ENCOUNTER — Other Ambulatory Visit: Payer: Self-pay | Admitting: Nurse Practitioner

## 2016-11-18 NOTE — Telephone Encounter (Signed)
Last seen June 2017

## 2016-11-18 NOTE — Telephone Encounter (Signed)
One mo plus one ref, writee needs appt before refill finished on rx

## 2016-12-03 ENCOUNTER — Other Ambulatory Visit: Payer: Self-pay | Admitting: Nurse Practitioner

## 2016-12-15 ENCOUNTER — Ambulatory Visit (INDEPENDENT_AMBULATORY_CARE_PROVIDER_SITE_OTHER): Payer: BLUE CROSS/BLUE SHIELD | Admitting: Nurse Practitioner

## 2016-12-15 ENCOUNTER — Encounter: Payer: Self-pay | Admitting: Nurse Practitioner

## 2016-12-15 VITALS — BP 134/98 | Temp 98.9°F | Ht 70.0 in | Wt 215.1 lb

## 2016-12-15 DIAGNOSIS — F418 Other specified anxiety disorders: Secondary | ICD-10-CM

## 2016-12-15 DIAGNOSIS — I10 Essential (primary) hypertension: Secondary | ICD-10-CM | POA: Diagnosis not present

## 2016-12-15 MED ORDER — TRIAMTERENE-HCTZ 75-50 MG PO TABS: ORAL_TABLET | ORAL | 1 refills | Status: DC

## 2016-12-15 MED ORDER — BUPROPION HCL ER (XL) 300 MG PO TB24: 300.0000 mg | ORAL_TABLET | Freq: Every day | ORAL | 1 refills | Status: DC

## 2016-12-15 MED ORDER — PHENTERMINE HCL 37.5 MG PO TABS
37.5000 mg | ORAL_TABLET | Freq: Every day | ORAL | 2 refills | Status: DC
Start: 2016-12-15 — End: 2017-08-17

## 2016-12-15 NOTE — Progress Notes (Signed)
Subjective:  Presents for recheck of BP. Taking 1/2 pill of Maxzide. Under more stress lately. Getting married in August and trying to blend 2 families. Quit smoking several months ago. Gained some weight since then. Doing well emotionally with Wellbutrin. No CP/ischemic type pain or SOB. No edema. Would like to restart Phentermine to lose recent 10 lbs of weight gain. Has taken in the past without difficulty.   Objective:   BP (!) 134/98   Temp 98.9 F (37.2 C) (Oral)   Ht 5\' 10"  (1.778 m)   Wt 215 lb 0.8 oz (97.5 kg)   BMI 30.86 kg/m  NAD. Alert, oriented. Lungs clear. Heart RRR. No murmur or gallop noted. BP on recheck left arm sitting 146/108.   Assessment:   Problem List Items Addressed This Visit      Cardiovascular and Mediastinum   Hypertension - Primary   Relevant Medications   triamterene-hydrochlorothiazide (MAXZIDE) 75-50 MG tablet     Other   Depression with anxiety       Plan:   Meds ordered this encounter  Medications  . buPROPion (WELLBUTRIN XL) 300 MG 24 hr tablet    Sig: Take 1 tablet (300 mg total) by mouth daily.    Dispense:  90 tablet    Refill:  1    Order Specific Question:   Supervising Provider    Answer:   Merlyn AlbertLUKING, WILLIAM S [[id number]]  . triamterene-hydrochlorothiazide (MAXZIDE) 75-50 MG tablet    Sig: TAKE ONE TABLET BY MOUTH IN THE MORNING    Dispense:  90 tablet    Refill:  1    Order Specific Question:   Supervising Provider    Answer:   Merlyn AlbertLUKING, WILLIAM S [[id number]]  . phentermine (ADIPEX-P) 37.5 MG tablet    Sig: Take 1 tablet (37.5 mg total) by mouth daily before breakfast.    Dispense:  30 tablet    Refill:  2    [facility]    Order Specific Question:   Supervising Provider    Answer:   Merlyn AlbertLUKING, WILLIAM S [[id number]]   Take a whole tablet of Maxzide. Monitor BP and call back if above 140/90.  Increase activity and limit calorie intake. May start Phentermine if BP stable.  Return in about 6 months (around 06/16/2017) for recheck.

## 2017-02-02 ENCOUNTER — Other Ambulatory Visit: Payer: Self-pay | Admitting: Nurse Practitioner

## 2017-03-26 ENCOUNTER — Other Ambulatory Visit: Payer: Self-pay | Admitting: Nurse Practitioner

## 2017-06-20 ENCOUNTER — Other Ambulatory Visit: Payer: Self-pay | Admitting: Nurse Practitioner

## 2017-08-14 ENCOUNTER — Encounter: Payer: Self-pay | Admitting: Nurse Practitioner

## 2017-08-17 ENCOUNTER — Other Ambulatory Visit: Payer: Self-pay | Admitting: Nurse Practitioner

## 2017-08-17 MED ORDER — PHENTERMINE-TOPIRAMATE ER 11.25-69 MG PO CP24: ORAL_CAPSULE | ORAL | 2 refills | Status: DC

## 2017-08-29 ENCOUNTER — Other Ambulatory Visit: Payer: Self-pay | Admitting: Nurse Practitioner

## 2017-09-14 ENCOUNTER — Ambulatory Visit (INDEPENDENT_AMBULATORY_CARE_PROVIDER_SITE_OTHER): Payer: BLUE CROSS/BLUE SHIELD | Admitting: Nurse Practitioner

## 2017-09-14 ENCOUNTER — Encounter: Payer: Self-pay | Admitting: Nurse Practitioner

## 2017-09-14 VITALS — BP 144/90 | Ht 70.0 in | Wt 213.2 lb

## 2017-09-14 DIAGNOSIS — F418 Other specified anxiety disorders: Secondary | ICD-10-CM

## 2017-09-14 DIAGNOSIS — I10 Essential (primary) hypertension: Secondary | ICD-10-CM

## 2017-09-14 MED ORDER — PHENTERMINE-TOPIRAMATE ER 11.25-69 MG PO CP24: ORAL_CAPSULE | ORAL | 2 refills | Status: DC

## 2017-09-14 MED ORDER — TRIAMTERENE-HCTZ 75-50 MG PO TABS: ORAL_TABLET | ORAL | 1 refills | Status: DC

## 2017-09-14 MED ORDER — BUPROPION HCL ER (XL) 300 MG PO TB24: 300.0000 mg | ORAL_TABLET | Freq: Every day | ORAL | 1 refills | Status: DC

## 2017-09-14 NOTE — Progress Notes (Signed)
Subjective:  Presents for routine follow up. Taking wellbutrin and maxide. Both working well. Drinking large amounts of water. Has cut out most of her tea. Does Zumba 2-3 times per week about 45 min per session. Despite activity, has not lost any weight. Restarted her Qsymia about 2 weeks ago. Other than dry mouth, no adverse effects. Overall healthy diet but states "her husband likes to cook". No CP/ischemic type pain or SOB. Feels her elevation in BP is due to her weight. Gets regular female physicals.   Objective:   BP (!) 144/90   Ht 5\' 10"  (1.778 m)   Wt 213 lb 3.2 oz (96.7 kg)   BMI 30.59 kg/m  NAD. Alert, oriented. Cheerful affect. Weight stable. Lungs clear. Heart RRR.   Assessment:   Problem List Items Addressed This Visit      Cardiovascular and Mediastinum   Hypertension - Primary   Relevant Medications   triamterene-hydrochlorothiazide (MAXZIDE) 75-50 MG tablet     Other   Depression with anxiety   Relevant Medications   buPROPion (WELLBUTRIN XL) 300 MG 24 hr tablet   Morbid obesity (HCC)   Relevant Medications   Phentermine-Topiramate (QSYMIA) 11.25-69 MG CP24       Plan:   Meds ordered this encounter  Medications  . Phentermine-Topiramate (QSYMIA) 11.25-69 MG CP24    Sig: Take one po qd    Dispense:  30 capsule    Refill:  2    Order Specific Question:   Supervising Provider    Answer:   Merlyn AlbertLUKING, WILLIAM S [[id number]]  . triamterene-hydrochlorothiazide (MAXZIDE) 75-50 MG tablet    Sig: TAKE 1 TABLET BY MOUTH EVERY DAY IN THE MORNING    Dispense:  90 tablet    Refill:  1    Order Specific Question:   Supervising Provider    Answer:   Merlyn AlbertLUKING, WILLIAM S [[id number]]  . buPROPion (WELLBUTRIN XL) 300 MG 24 hr tablet    Sig: Take 1 tablet (300 mg total) by mouth daily.    Dispense:  90 tablet    Refill:  1    Order Specific Question:   Supervising Provider    Answer:   Merlyn AlbertLUKING, WILLIAM S [[id number]]   Continue Qsymia. Recommend calorie counting such as my fitness pal app or  other resource. Continue activity. Continue other meds.  Recommend checking BP outside of office; contact us if it remains above 140/90.  Return in about 6 months (around 03/17/2018) for follow up; in 3 months for refills on Qsymia.

## 2017-10-21 ENCOUNTER — Other Ambulatory Visit: Payer: Self-pay | Admitting: Nurse Practitioner

## 2017-10-21 ENCOUNTER — Encounter: Payer: Self-pay | Admitting: Nurse Practitioner

## 2017-10-21 MED ORDER — PHENTERMINE-TOPIRAMATE ER 15-92 MG PO CP24: ORAL_CAPSULE | ORAL | 2 refills | Status: DC

## 2017-10-22 ENCOUNTER — Other Ambulatory Visit: Payer: Self-pay | Admitting: Nurse Practitioner

## 2017-10-22 MED ORDER — PHENTERMINE-TOPIRAMATE ER 15-92 MG PO CP24: ORAL_CAPSULE | ORAL | 2 refills | Status: DC

## 2018-02-17 ENCOUNTER — Other Ambulatory Visit: Payer: Self-pay

## 2018-02-17 ENCOUNTER — Telehealth: Payer: Self-pay | Admitting: Family Medicine

## 2018-02-17 MED ORDER — PHENTERMINE-TOPIRAMATE ER 15-92 MG PO CP24: ORAL_CAPSULE | ORAL | 2 refills | Status: DC

## 2018-02-17 NOTE — Telephone Encounter (Signed)
Fax from pharmacy requesting refill on Qsymia 15 mg-92 mg capsule. Take one capsule by mouth every day.

## 2018-02-17 NOTE — Telephone Encounter (Signed)
Script printed awaiting signature.  

## 2018-02-17 NOTE — Telephone Encounter (Signed)
Script faxed to pharmacy

## 2018-02-17 NOTE — Telephone Encounter (Signed)
Ok three mo, should have six mo ck up in one mo

## 2018-03-22 ENCOUNTER — Other Ambulatory Visit: Payer: Self-pay | Admitting: *Deleted

## 2018-03-23 MED ORDER — BUPROPION HCL ER (XL) 300 MG PO TB24: 300.0000 mg | ORAL_TABLET | Freq: Every day | ORAL | 0 refills | Status: DC

## 2018-03-23 NOTE — Telephone Encounter (Signed)
Ok, on it write six mo visit due

## 2018-04-21 DIAGNOSIS — Z23 Encounter for immunization: Secondary | ICD-10-CM | POA: Diagnosis not present

## 2018-07-05 ENCOUNTER — Other Ambulatory Visit: Payer: Self-pay | Admitting: Family Medicine

## 2018-07-05 ENCOUNTER — Other Ambulatory Visit: Payer: Self-pay

## 2018-07-05 MED ORDER — PHENTERMINE-TOPIRAMATE ER 15-92 MG PO CP24: ORAL_CAPSULE | ORAL | 0 refills | Status: DC

## 2018-07-05 NOTE — Telephone Encounter (Signed)
Pt contacted and informed that we would give her one month but would need OV before any further refills. Pt verbalized understanding and stated that she was going to call and make an appt due to itching on hands and feet. Pt transferred up front for appt.

## 2018-07-05 NOTE — Telephone Encounter (Signed)
One mo ok, needs o vbefore further (and if they deny will nedd o v first)

## 2018-07-07 ENCOUNTER — Ambulatory Visit: Payer: Self-pay | Admitting: Family Medicine

## 2018-07-07 ENCOUNTER — Encounter: Payer: Self-pay | Admitting: Family Medicine

## 2018-07-07 VITALS — BP 122/88 | Temp 97.6°F | Ht 70.0 in | Wt 213.8 lb

## 2018-07-07 DIAGNOSIS — L299 Pruritus, unspecified: Secondary | ICD-10-CM

## 2018-07-07 DIAGNOSIS — R21 Rash and other nonspecific skin eruption: Secondary | ICD-10-CM

## 2018-07-07 MED ORDER — PREDNISONE 20 MG PO TABS: ORAL_TABLET | ORAL | 0 refills | Status: DC

## 2018-07-07 NOTE — Progress Notes (Signed)
   Subjective:    Patient ID: Eileen Nunez, female    DOB: [birth year], 38 y.o.   MRN: [id number]  HPIIching on bottom of feet, hands, front part of elbow, and back of neck. Tried benadryl.   Itching has kicked in  Fairly significant   Last wed or thur first started   Developed the itching in the hand and feet and back of neck   And whelps up   When scratches  But not a rash  Itching prety severe at times, using benadryl to try  No new foods or meds  Did have on piece of fish with chicken breader    Overall stress level has calmed down, recently quit jobp now doing own business Review of Systems No headache, no major weight loss or weight gain, no chest pain no back pain abdominal pain no change in bowel habits complete ROS otherwise negative     Objective:   Physical Exam  Alert vitals stable, NAD. Blood pressure good on repeat. HEENT normal. Lungs clear. Heart regular rate and rhythm. Hands slight intertriginous erythema no other rash noted      Assessment & Plan:  Impression excessive itching along with intermittent red rash hands and feet.  No recent new exposures.  Some increased stress with new job.  Discussed.  Hold off on major work-up at this time.  Prednisone taper.  Benadryl as needed.  1 months worth of a histamine 2 blocker such as Pepcid.  Hopefully this will fade out and not require major work-up

## 2018-07-07 NOTE — Patient Instructions (Signed)
pepcid 40 mg daily for the next 30 days

## 2018-08-04 ENCOUNTER — Other Ambulatory Visit: Payer: Self-pay | Admitting: *Deleted

## 2018-08-04 MED ORDER — TRIAMTERENE-HCTZ 75-50 MG PO TABS: ORAL_TABLET | ORAL | 0 refills | Status: DC

## 2018-08-04 NOTE — Telephone Encounter (Signed)
30 d worth, next mo will be one yr six mo visit way overdue

## 2018-08-07 ENCOUNTER — Other Ambulatory Visit: Payer: Self-pay | Admitting: Family Medicine

## 2018-09-17 NOTE — Telephone Encounter (Signed)
Three mo ok 

## 2018-10-11 ENCOUNTER — Other Ambulatory Visit: Payer: Self-pay | Admitting: Family Medicine

## 2018-10-29 ENCOUNTER — Other Ambulatory Visit: Payer: Self-pay | Admitting: Family Medicine

## 2018-11-03 ENCOUNTER — Other Ambulatory Visit: Payer: Self-pay | Admitting: Family Medicine

## 2018-11-22 ENCOUNTER — Other Ambulatory Visit: Payer: Self-pay | Admitting: Family Medicine

## 2018-11-22 NOTE — Telephone Encounter (Signed)
Give 30, schedule o v with carolyn for htn f u

## 2018-11-29 ENCOUNTER — Ambulatory Visit (INDEPENDENT_AMBULATORY_CARE_PROVIDER_SITE_OTHER): Payer: Self-pay | Admitting: Nurse Practitioner

## 2018-11-29 ENCOUNTER — Encounter: Payer: Self-pay | Admitting: Nurse Practitioner

## 2018-11-29 ENCOUNTER — Other Ambulatory Visit: Payer: Self-pay

## 2018-11-29 VITALS — BP 134/92 | Temp 97.1°F | Wt 211.2 lb

## 2018-11-29 DIAGNOSIS — I10 Essential (primary) hypertension: Secondary | ICD-10-CM

## 2018-11-29 DIAGNOSIS — R5383 Other fatigue: Secondary | ICD-10-CM

## 2018-11-29 DIAGNOSIS — F418 Other specified anxiety disorders: Secondary | ICD-10-CM

## 2018-11-29 MED ORDER — TRIAMTERENE-HCTZ 75-50 MG PO TABS: 1.0000 | ORAL_TABLET | Freq: Every day | ORAL | 1 refills | Status: DC

## 2018-11-29 MED ORDER — BUPROPION HCL ER (XL) 300 MG PO TB24: 300.0000 mg | ORAL_TABLET | Freq: Every day | ORAL | 1 refills | Status: DC

## 2018-11-29 MED ORDER — LISINOPRIL 5 MG PO TABS: ORAL_TABLET | ORAL | 1 refills | Status: DC

## 2018-11-29 NOTE — Progress Notes (Signed)
   Subjective:    Patient ID: Eileen Nunez, female    DOB: 06/08/1981, 38 y.o.   MRN: [id number]  Hypertension  This is a chronic problem. There are no compliance problems.   Presents for recheck on HTN. Has not checked BP outside of office. Does fairly well with her diet. Very active lifestyle. Walking several miles per day most days. Also swims in the summer. Stress level much better. Still having difficulty losing weight. Stopped her Qsymia for a few months. Noticed her weight has increased slightly. Compliant with BP meds. Gets regular PE with GYN.     Review of Systems No CP, SOB. Mild edema when she misses her fluid pill. Mild fatigue. Depression and anxiety stable on Buproprion. PHQ2 neg.     Objective:   Physical Exam NAD. Alert, oriented. Cheerful affect. Thoughts logical, coherent and relevant. Lungs clear. Thyroid non tender to palpation, no mass or goiter noted. Heart RRR. LE: no edema. BP on recheck left arm sitting: 146/102.       Assessment & Plan:   Problem List Items Addressed This Visit      Cardiovascular and Mediastinum   Hypertension - Primary   Relevant Medications   triamterene-hydrochlorothiazide (MAXZIDE) 75-50 MG tablet   lisinopril (ZESTRIL) 5 MG tablet   Other Relevant Orders   CBC with Differential   Vitamin D (25 hydroxy)   TSH   Lipid Profile   COMPLETE METABOLIC PANEL WITH GFR     Other   Depression with anxiety   Relevant Medications   buPROPion (WELLBUTRIN XL) 300 MG 24 hr tablet   Other Relevant Orders   CBC with Differential   Vitamin D (25 hydroxy)   TSH   Lipid Profile   COMPLETE METABOLIC PANEL WITH GFR   Morbid obesity (HCC)   Relevant Orders   CBC with Differential   Vitamin D (25 hydroxy)   TSH   Lipid Profile   COMPLETE METABOLIC PANEL WITH GFR    Other Visit Diagnoses    Fatigue, unspecified type       Relevant Orders   CBC with Differential   Vitamin D (25 hydroxy)   TSH   Lipid Profile   COMPLETE METABOLIC  PANEL WITH GFR     Consider weight loss program such as weight watchers. Watch salt intake.  Start Lisinopril. Reviewed potential adverse effects. DC med and call if any issues. Recheck BP outside of office and send results through [facility].  Hold on Qsymia for now until BP is under better control.  Routine labs pending.  Return in about 3 months (around 03/01/2019) for HTN. Call back sooner if needed.

## 2019-01-06 ENCOUNTER — Other Ambulatory Visit: Payer: Self-pay | Admitting: Family Medicine

## 2019-01-06 NOTE — Telephone Encounter (Signed)
Controlled sub three mo worth

## 2019-05-05 ENCOUNTER — Other Ambulatory Visit: Payer: Self-pay | Admitting: Family Medicine

## 2019-05-06 NOTE — Telephone Encounter (Signed)
3 mo ok 

## 2019-06-18 ENCOUNTER — Other Ambulatory Visit: Payer: Self-pay | Admitting: Nurse Practitioner

## 2019-07-23 ENCOUNTER — Other Ambulatory Visit: Payer: Self-pay | Admitting: Nurse Practitioner

## 2019-07-25 ENCOUNTER — Encounter: Payer: Self-pay | Admitting: Family Medicine

## 2019-07-25 NOTE — Telephone Encounter (Signed)
Ok times one rec f u with me or carolyn with next mo

## 2019-08-14 ENCOUNTER — Other Ambulatory Visit: Payer: Self-pay | Admitting: Family Medicine

## 2019-09-25 ENCOUNTER — Encounter: Payer: Self-pay | Admitting: Nurse Practitioner

## 2019-09-28 ENCOUNTER — Other Ambulatory Visit: Payer: Self-pay | Admitting: Nurse Practitioner

## 2019-09-28 ENCOUNTER — Other Ambulatory Visit: Payer: Self-pay | Admitting: Family Medicine

## 2019-09-28 MED ORDER — QSYMIA 15-92 MG PO CP24: ORAL_CAPSULE | ORAL | 0 refills | Status: DC

## 2019-09-29 ENCOUNTER — Other Ambulatory Visit: Payer: Self-pay | Admitting: *Deleted

## 2019-09-29 NOTE — Telephone Encounter (Signed)
Med sent to [facility] and pt wanted it sent to [facility] [address]. I will call and cancel the one at [facility].

## 2019-09-30 ENCOUNTER — Other Ambulatory Visit: Payer: Self-pay | Admitting: Nurse Practitioner

## 2019-09-30 MED ORDER — QSYMIA 15-92 MG PO CP24: ORAL_CAPSULE | ORAL | 0 refills | Status: DC

## 2019-09-30 NOTE — Telephone Encounter (Signed)
Done

## 2019-11-04 ENCOUNTER — Other Ambulatory Visit: Payer: Self-pay

## 2019-11-04 ENCOUNTER — Telehealth (INDEPENDENT_AMBULATORY_CARE_PROVIDER_SITE_OTHER): Payer: 59 | Admitting: Nurse Practitioner

## 2019-11-04 DIAGNOSIS — F418 Other specified anxiety disorders: Secondary | ICD-10-CM

## 2019-11-04 DIAGNOSIS — I10 Essential (primary) hypertension: Secondary | ICD-10-CM | POA: Diagnosis not present

## 2019-11-04 MED ORDER — QSYMIA 15-92 MG PO CP24: ORAL_CAPSULE | ORAL | 2 refills | Status: DC

## 2019-11-04 MED ORDER — BUPROPION HCL ER (XL) 300 MG PO TB24: ORAL_TABLET | ORAL | 0 refills | Status: DC

## 2019-11-04 NOTE — Progress Notes (Signed)
PHONE VISIT Subjective:    Patient ID: Eileen Nunez, female    DOB: 12-May-1981, 39 y.o.   MRN: [id number]  HPI Patient calls in today for a follow up appointment for her medications.  Patient states she is doing well and not having any problems or concerns.    Virtual Visit via Video Note  I connected with Eileen Nunez on 11/04/19 at  2:00 PM EDT by a video enabled telemedicine application and verified that I am speaking with the correct person using two identifiers.  Location: Patient: home Provider: office   I discussed the limitations of evaluation and management by telemedicine and the availability of in person appointments. The patient expressed understanding and agreed to proceed.  History of Present Illness: Presents for routine follow up. Eating healthy. Has decreased sugar intake. Does extremely well on Qsymia. Every time she stops her weight goes up over 10 lbs and comes off quickly when she restarts. Denies any adverse effects. Within 3 weeks of stopping med this last time, her weight jumped up about 12 lbs. Would like to restart and continue for now.  Has been under more stress lately. Her son will be leaving for college soon about 8 hours away.  Doing well on Buproprion for her anxiety. Depression improved. Chose to never start Lisinopril. Has not checked BP outside of the office but notes that it goes up with her weight gain.  Taking Maxzide daily for BP. Minimal edema only when she eats high sodium foods.  Gets regular women's health exams with GYN.  Depression screen [facility] 2/9 11/04/2019 11/29/2018 09/14/2017  Decreased Interest 0 0 1  Down, Depressed, Hopeless 0 0 0  PHQ - 2 Score 0 0 1  Altered sleeping 0 - -  Tired, decreased energy 1 - -  Change in appetite 0 - -  Feeling bad or failure about yourself  0 - -  Trouble concentrating 0 - -  Moving slowly or fidgety/restless 0 - -  Suicidal thoughts 0 - -  PHQ-9 Score 1 - -  Difficult doing work/chores Not  difficult at all - -       Observations/Objective: Today's visit was via telephone Physical exam was not possible for this visit Alert, oriented. Cheerful, calm affect.   Assessment and Plan: Problem List Items Addressed This Visit      Cardiovascular and Mediastinum   Hypertension - Primary     Other   Depression with anxiety   Relevant Medications   buPROPion (WELLBUTRIN XL) 300 MG 24 hr tablet   Morbid obesity (HCC)   Relevant Medications   Phentermine-Topiramate (QSYMIA) 15-92 MG CP24     Meds ordered this encounter  Medications  . Phentermine-Topiramate (QSYMIA) 15-92 MG CP24    Sig: Take one po qd    Dispense:  30 capsule    Refill:  2    Order Specific Question:   Supervising Provider    Answer:   Lilyan Punt A [[id number]]  . buPROPion (WELLBUTRIN XL) 300 MG 24 hr tablet    Sig: TAKE 1 TABLET(300 MG) BY MOUTH DAILY    Dispense:  90 tablet    Refill:  0    Order Specific Question:   Supervising Provider    Answer:   Lilyan Punt A [[id number]]     Follow Up Instructions: Take Qsymia as directed. Continue healthy eating habits. Increase activity. Continue other medications as directed.  Patient to check BP and send results to the office. Return in  about 3 months (around 02/04/2020).   I discussed the assessment and treatment plan with the patient. The patient was provided an opportunity to ask questions and all were answered. The patient agreed with the plan and demonstrated an understanding of the instructions.   The patient was advised to call back or seek an in-person evaluation if the symptoms worsen or if the condition fails to improve as anticipated.  I provided 15 minutes of non-face-to-face time during this encounter.  Review of Systems     Objective:   Physical Exam        Assessment & Plan:

## 2019-11-05 ENCOUNTER — Encounter: Payer: Self-pay | Admitting: Nurse Practitioner

## 2019-12-30 ENCOUNTER — Other Ambulatory Visit: Payer: Self-pay | Admitting: Family Medicine

## 2020-01-02 ENCOUNTER — Encounter: Payer: Self-pay | Admitting: Nurse Practitioner

## 2020-01-03 NOTE — Telephone Encounter (Signed)
Check up on 11/04/19

## 2020-01-03 NOTE — Telephone Encounter (Signed)
Appt 7/29. Which labs do you want pt to have before visit

## 2020-01-03 NOTE — Telephone Encounter (Signed)
Has pt had any labs anywhere else?  Last labs in 2017.  She needs labs before more refills. Thx. Dr. Ladona Ridgel

## 2020-01-15 ENCOUNTER — Other Ambulatory Visit: Payer: Self-pay | Admitting: Nurse Practitioner

## 2020-01-19 ENCOUNTER — Ambulatory Visit: Payer: 59 | Admitting: Nurse Practitioner

## 2020-01-19 ENCOUNTER — Encounter: Payer: Self-pay | Admitting: Nurse Practitioner

## 2020-01-19 ENCOUNTER — Other Ambulatory Visit: Payer: Self-pay

## 2020-01-19 VITALS — BP 118/88 | Temp 97.8°F | Wt 211.4 lb

## 2020-01-19 DIAGNOSIS — I10 Essential (primary) hypertension: Secondary | ICD-10-CM | POA: Diagnosis not present

## 2020-01-19 DIAGNOSIS — Z13220 Encounter for screening for lipoid disorders: Secondary | ICD-10-CM

## 2020-01-19 DIAGNOSIS — F418 Other specified anxiety disorders: Secondary | ICD-10-CM | POA: Diagnosis not present

## 2020-01-19 DIAGNOSIS — R5383 Other fatigue: Secondary | ICD-10-CM | POA: Diagnosis not present

## 2020-01-19 MED ORDER — LISINOPRIL 5 MG PO TABS
5.0000 mg | ORAL_TABLET | Freq: Every day | ORAL | 1 refills | Status: DC
Start: 2020-01-19 — End: 2020-07-11

## 2020-01-19 MED ORDER — QSYMIA 15-92 MG PO CP24: ORAL_CAPSULE | ORAL | 2 refills | Status: DC

## 2020-01-19 NOTE — Progress Notes (Signed)
   Subjective:    Patient ID: Eileen Nunez, female    DOB: January 16, 1981, 39 y.o.   MRN: [id number]  Hypertension This is a chronic problem. Treatments tried: Maxzide 75-50mg . There are no compliance problems.   Has been back on lisinopril 5 mg for the past few weeks, BP has improved.  Has checked her BP once outside the office, 118/80.  Would like to continue the Qsymia for her weight maintenance, states she gains weight as soon as she stops.  Doing well on Wellbutrin.  Denies any chest pain/ischemic type pain shortness of breath or edema.  Gets regular gynecological exams.  Defers Covid vaccine.  Concerns discussed.     Review of Systems     Objective:   Physical Exam NAD.  Alert, oriented.  Lungs clear.  Heart regular rate and rhythm.  No murmur or gallop noted.  Lower extremities no edema. Today's Vitals   01/19/20 0912  BP: (!) 118/88  Temp: 97.8 F (36.6 C)  Weight: (!) 211 lb 6.4 oz (95.9 kg)   Body mass index is 30.33 kg/m.        Assessment & Plan:   Problem List Items Addressed This Visit      Cardiovascular and Mediastinum   Hypertension - Primary   Relevant Medications   lisinopril (ZESTRIL) 5 MG tablet   Other Relevant Orders   Comprehensive Metabolic Panel (CMET)   CBC with Differential/Platelet     Other   Depression with anxiety    Other Visit Diagnoses    Fatigue, unspecified type       Relevant Orders   Comprehensive Metabolic Panel (CMET)   TSH   VITAMIN D 25 Hydroxy (Vit-D Deficiency, Fractures)   CBC with Differential/Platelet   Screening, lipid       Relevant Orders   Lipid panel   VITAMIN D 25 Hydroxy (Vit-D Deficiency, Fractures)   CBC with Differential/Platelet     Meds ordered this encounter  Medications  . Phentermine-Topiramate (QSYMIA) 15-92 MG CP24    Sig: Take one po qd    Dispense:  30 capsule    Refill:  2    Order Specific Question:   Supervising Provider    Answer:   Lilyan Punt A [[id number]]  . lisinopril (ZESTRIL) 5  MG tablet    Sig: Take 1 tablet (5 mg total) by mouth daily.    Dispense:  90 tablet    Refill:  1    Order Specific Question:   Supervising Provider    Answer:   Lilyan Punt A [id number]   Continue current regimen as directed.  Encouraged regular activity and healthy diet. Patient to get her lab work done this morning, has had a V8 drink this morning but want to be sure that she can get this done while she is here. Follow-up in 6 months for blood pressure, follow-up in 3 months if she wishes to continue Qsymia.

## 2020-01-20 ENCOUNTER — Encounter: Payer: Self-pay | Admitting: Nurse Practitioner

## 2020-01-20 ENCOUNTER — Other Ambulatory Visit: Payer: Self-pay | Admitting: *Deleted

## 2020-01-20 ENCOUNTER — Other Ambulatory Visit: Payer: Self-pay | Admitting: Nurse Practitioner

## 2020-01-20 DIAGNOSIS — E039 Hypothyroidism, unspecified: Secondary | ICD-10-CM

## 2020-01-20 LAB — CBC WITH DIFFERENTIAL/PLATELET
Basophils Absolute: 0.1 10*3/uL (ref 0.0–0.2)
Basos: 1 %
EOS (ABSOLUTE): 0.1 10*3/uL (ref 0.0–0.4)
Eos: 1 %
Hematocrit: 42.9 % (ref 34.0–46.6)
Hemoglobin: 14.9 g/dL (ref 11.1–15.9)
Immature Grans (Abs): 0 10*3/uL (ref 0.0–0.1)
Immature Granulocytes: 0 %
Lymphocytes Absolute: 2.1 10*3/uL (ref 0.7–3.1)
Lymphs: 25 %
MCH: 32.9 pg (ref 26.6–33.0)
MCHC: 34.7 g/dL (ref 31.5–35.7)
MCV: 95 fL (ref 79–97)
Monocytes Absolute: 0.5 10*3/uL (ref 0.1–0.9)
Monocytes: 5 %
Neutrophils Absolute: 5.7 10*3/uL (ref 1.4–7.0)
Neutrophils: 68 %
Platelets: 228 10*3/uL (ref 150–450)
RBC: 4.53 x10E6/uL (ref 3.77–5.28)
RDW: 13.3 % (ref 11.7–15.4)
WBC: 8.4 10*3/uL (ref 3.4–10.8)

## 2020-01-20 LAB — LIPID PANEL
Chol/HDL Ratio: 3.1 ratio (ref 0.0–4.4)
Cholesterol, Total: 177 mg/dL (ref 100–199)
HDL: 57 mg/dL (ref >39)
LDL Chol Calc (NIH): 91 mg/dL (ref 0–99)
Triglycerides: 173 mg/dL — ABNORMAL HIGH (ref 0–149)
VLDL Cholesterol Cal: 29 mg/dL (ref 5–40)

## 2020-01-20 LAB — COMPREHENSIVE METABOLIC PANEL
ALT: 35 IU/L — ABNORMAL HIGH (ref 0–32)
AST: 23 IU/L (ref 0–40)
Albumin/Globulin Ratio: 1.8 (ref 1.2–2.2)
Albumin: 4.6 g/dL (ref 3.8–4.8)
Alkaline Phosphatase: 78 IU/L (ref 48–121)
BUN/Creatinine Ratio: 11 (ref 9–23)
BUN: 12 mg/dL (ref 6–24)
Bilirubin Total: 1.3 mg/dL — ABNORMAL HIGH (ref 0.0–1.2)
CO2: 24 mmol/L (ref 20–29)
Calcium: 9.3 mg/dL (ref 8.7–10.2)
Chloride: 100 mmol/L (ref 96–106)
Creatinine, Ser: 1.08 mg/dL — ABNORMAL HIGH (ref 0.57–1.00)
GFR calc Af Amer: 74 mL/min/{1.73_m2} (ref >59)
GFR calc non Af Amer: 64 mL/min/{1.73_m2} (ref >59)
Globulin, Total: 2.6 g/dL (ref 1.5–4.5)
Glucose: 90 mg/dL (ref 65–99)
Potassium: 4.2 mmol/L (ref 3.5–5.2)
Sodium: 140 mmol/L (ref 134–144)
Total Protein: 7.2 g/dL (ref 6.0–8.5)

## 2020-01-20 LAB — VITAMIN D 25 HYDROXY (VIT D DEFICIENCY, FRACTURES): Vit D, 25-Hydroxy: 44.3 ng/mL (ref 30.0–100.0)

## 2020-01-20 LAB — TSH: TSH: 5.08 u[IU]/mL — ABNORMAL HIGH (ref 0.450–4.500)

## 2020-01-20 MED ORDER — LEVOTHYROXINE SODIUM 50 MCG PO TABS
50.0000 ug | ORAL_TABLET | Freq: Every day | ORAL | 0 refills | Status: DC
Start: 2020-01-20 — End: 2020-01-20

## 2020-01-20 MED ORDER — LEVOTHYROXINE SODIUM 50 MCG PO TABS: 50.0000 ug | ORAL_TABLET | Freq: Every day | ORAL | 0 refills | Status: DC

## 2020-03-21 ENCOUNTER — Encounter: Payer: Self-pay | Admitting: Nurse Practitioner

## 2020-03-23 ENCOUNTER — Other Ambulatory Visit: Payer: Self-pay | Admitting: Nurse Practitioner

## 2020-03-23 ENCOUNTER — Encounter: Payer: Self-pay | Admitting: Nurse Practitioner

## 2020-03-23 DIAGNOSIS — E039 Hypothyroidism, unspecified: Secondary | ICD-10-CM

## 2020-04-11 LAB — TSH: TSH: 4.06 u[IU]/mL (ref 0.450–4.500)

## 2020-04-13 ENCOUNTER — Ambulatory Visit: Payer: 59 | Admitting: Nurse Practitioner

## 2020-04-13 ENCOUNTER — Encounter: Payer: Self-pay | Admitting: Nurse Practitioner

## 2020-04-13 ENCOUNTER — Other Ambulatory Visit: Payer: Self-pay

## 2020-04-13 VITALS — BP 126/78 | HR 99 | Temp 94.9°F | Wt 211.4 lb

## 2020-04-13 DIAGNOSIS — E039 Hypothyroidism, unspecified: Secondary | ICD-10-CM | POA: Diagnosis not present

## 2020-04-13 DIAGNOSIS — F418 Other specified anxiety disorders: Secondary | ICD-10-CM | POA: Diagnosis not present

## 2020-04-13 DIAGNOSIS — I10 Essential (primary) hypertension: Secondary | ICD-10-CM | POA: Diagnosis not present

## 2020-04-13 DIAGNOSIS — Z23 Encounter for immunization: Secondary | ICD-10-CM | POA: Diagnosis not present

## 2020-04-13 MED ORDER — LEVOTHYROXINE SODIUM 75 MCG PO TABS
75.0000 ug | ORAL_TABLET | Freq: Every day | ORAL | 0 refills | Status: DC
Start: 2020-04-13 — End: 2020-07-11

## 2020-04-13 NOTE — Patient Instructions (Addendum)
Follow up in 6 months.   Mediterranean Diet A Mediterranean diet refers to food and lifestyle choices that are based on the traditions of countries located on the [employer]. This way of eating has been shown to help prevent certain conditions and improve outcomes for people who have chronic diseases, like kidney disease and heart disease. What are tips for following this plan? Lifestyle  Cook and eat meals together with your family, when possible.  Drink enough fluid to keep your urine clear or pale yellow.  Be physically active every day. This includes: ? Aerobic exercise like running or swimming. ? Leisure activities like gardening, walking, or housework.  Get 7-8 hours of sleep each night.  If recommended by your health care provider, drink red wine in moderation. This means 1 glass a day for nonpregnant women and 2 glasses a day for men. A glass of wine equals 5 oz (150 mL). Reading food labels   Check the serving size of packaged foods. For foods such as rice and pasta, the serving size refers to the amount of cooked product, not dry.  Check the total fat in packaged foods. Avoid foods that have saturated fat or trans fats.  Check the ingredients list for added sugars, such as corn syrup. Shopping  At the grocery store, buy most of your food from the areas near the walls of the store. This includes: ? Fresh fruits and vegetables (produce). ? Grains, beans, nuts, and seeds. Some of these may be available in unpackaged forms or large amounts (in bulk). ? Fresh seafood. ? Poultry and eggs. ? Low-fat dairy products.  Buy whole ingredients instead of prepackaged foods.  Buy fresh fruits and vegetables in-season from local farmers markets.  Buy frozen fruits and vegetables in resealable bags.  If you do not have access to quality fresh seafood, buy precooked frozen shrimp or canned fish, such as tuna, salmon, or sardines.  Buy small amounts of raw or cooked  vegetables, salads, or olives from the deli or salad bar at your store.  Stock your pantry so you always have certain foods on hand, such as olive oil, canned tuna, canned tomatoes, rice, pasta, and beans. Cooking  Cook foods with extra-virgin olive oil instead of using butter or other vegetable oils.  Have meat as a side dish, and have vegetables or grains as your main dish. This means having meat in small portions or adding small amounts of meat to foods like pasta or stew.  Use beans or vegetables instead of meat in common dishes like chili or lasagna.  Experiment with different cooking methods. Try roasting or broiling vegetables instead of steaming or sauteing them.  Add frozen vegetables to soups, stews, pasta, or rice.  Add nuts or seeds for added healthy fat at each meal. You can add these to yogurt, salads, or vegetable dishes.  Marinate fish or vegetables using olive oil, lemon juice, garlic, and fresh herbs. Meal planning   Plan to eat 1 vegetarian meal one day each week. Try to work up to 2 vegetarian meals, if possible.  Eat seafood 2 or more times a week.  Have healthy snacks readily available, such as: ? Vegetable sticks with hummus. ? [place] yogurt. ? Fruit and nut trail mix.  Eat balanced meals throughout the week. This includes: ? Fruit: 2-3 servings a day ? Vegetables: 4-5 servings a day ? Low-fat dairy: 2 servings a day ? Fish, poultry, or lean meat: 1 serving a day ? Beans and  legumes: 2 or more servings a week ? Nuts and seeds: 1-2 servings a day ? Whole grains: 6-8 servings a day ? Extra-virgin olive oil: 3-4 servings a day  Limit red meat and sweets to only a few servings a month What are my food choices?  Mediterranean diet ? Recommended  Grains: Whole-grain pasta. Brown rice. Bulgar wheat. Polenta. Couscous. Whole-wheat bread. Orpah Cobb.  Vegetables: Artichokes. Beets. Broccoli. Cabbage. Carrots. Eggplant. Green beans. Chard. Kale.  Spinach. Onions. Leeks. Peas. Squash. Tomatoes. Peppers. Radishes.  Fruits: Apples. Apricots. Avocado. Berries. Bananas. Cherries. Dates. Figs. Grapes. Lemons. Melon. Oranges. Peaches. Plums. Pomegranate.  Meats and other protein foods: Beans. Almonds. Sunflower seeds. Pine nuts. Peanuts. Cod. Salmon. Scallops. Shrimp. Tuna. Tilapia. Clams. Oysters. Eggs.  Dairy: Low-fat milk. Cheese. Greek yogurt.  Beverages: Water. Red wine. Herbal tea.  Fats and oils: Extra virgin olive oil. Avocado oil. Grape seed oil.  Sweets and desserts: [place] yogurt with honey. Baked apples. Poached pears. Trail mix.  Seasoning and other foods: Basil. Cilantro. Coriander. Cumin. Mint. Parsley. Sage. Rosemary. Tarragon. Garlic. Oregano. Thyme. Pepper. Balsalmic vinegar. Tahini. Hummus. Tomato sauce. Olives. Mushrooms. ? Limit these  Grains: Prepackaged pasta or rice dishes. Prepackaged cereal with added sugar.  Vegetables: Deep fried potatoes (french fries).  Fruits: Fruit canned in syrup.  Meats and other protein foods: Beef. Pork. Lamb. Poultry with skin. Hot dogs. Tomasa Blase.  Dairy: Ice cream. Sour cream. Whole milk.  Beverages: Juice. Sugar-sweetened soft drinks. Beer. Liquor and spirits.  Fats and oils: Butter. Canola oil. Vegetable oil. Beef fat (tallow). Lard.  Sweets and desserts: Cookies. Cakes. Pies. Candy.  Seasoning and other foods: Mayonnaise. Premade sauces and marinades. The items listed may not be a complete list. Talk with your dietitian about what dietary choices are right for you. Summary  The Mediterranean diet includes both food and lifestyle choices.  Eat a variety of fresh fruits and vegetables, beans, nuts, seeds, and whole grains.  Limit the amount of red meat and sweets that you eat.  Talk with your health care provider about whether it is safe for you to drink red wine in moderation. This means 1 glass a day for nonpregnant women and 2 glasses a day for men. A glass of wine  equals 5 oz (150 mL). This information is not intended to replace advice given to you by your health care provider. Make sure you discuss any questions you have with your health care provider. Document Revised: 02/07/2016 Document Reviewed: 01/31/2016 Elsevier Patient Education  2020 [employer].

## 2020-04-13 NOTE — Progress Notes (Signed)
   Subjective:    Patient ID: Eileen Nunez, female    DOB: April 01, 1981, 39 y.o.   MRN: [id number]  HPI: Pt is here for follow up on medication. Pt states doing well. Does not check BP outside of office that much. Expresses frustration with not losing weight.  She gets her annual physical and her mammogram through her OB/GYN Has maintained her weight loss with Qysmia. Doing well on Buproprion.  Review of Systems    Pt denies fatigue, fever, chills, N/V, cold/heat intolerance, chest pain, palpitations, leg swelling, or syncopal episodes. No sore throats or difficulty swallowing.  Objective:   Physical Exam   Breath sounds are equal and clear bilaterally without wheezing or rhonchi. Heart sounds are normal rate and rhythm without murmur or gallops.  No leg swelling noted. Thyroid without mass, tenderness or thyromegaly. No cervical adenopathy noted. Today's Vitals   04/13/20 0901  BP: 126/78  Pulse: 99  Temp: (!) 94.9 F (34.9 C)  SpO2: 98%  Weight: 95.9 kg   Body mass index is 30.33 kg/m. Results for orders placed or performed in visit on 03/23/20  TSH  Result Value Ref Range   TSH 4.060 0.450 - 4.500 uIU/mL        Assessment & Plan:   Problem List Items Addressed This Visit      Cardiovascular and Mediastinum   Hypertension     Endocrine   Hypothyroidism - Primary   Relevant Medications   levothyroxine (SYNTHROID) 75 MCG tablet   Other Relevant Orders   TSH     Other   Depression with anxiety   Morbid obesity (HCC)    Other Visit Diagnoses    Need for vaccination       Relevant Orders   Flu Vaccine QUAD 6+ mos PF IM (Fluarix Quad PF) (Completed)     Meds ordered this encounter  Medications  . levothyroxine (SYNTHROID) 75 MCG tablet    Sig: Take 1 tablet (75 mcg total) by mouth daily.    Dispense:  90 tablet    Refill:  0    Order Specific Question:   Supervising Provider    Answer:   Babs Sciara [phone]   Reviewed recent lab work with patient.  Discussed increasing synthroid dose to 75 mcg. Follow up lab work in 8-12 weeks.  Discussed several diet options, has tried Weight Watchers many times. Encouraged options such as Noom or the Mediterranean diet. Defers COVID vaccine.   Return in about 6 months (around 10/12/2020) for follow up.

## 2020-04-13 NOTE — Progress Notes (Signed)
   Subjective:    Patient ID: Eileen Nunez, female    DOB: [birth year], 39 y.o.   MRN: [id number]  HPI    Review of Systems     Objective:   Physical Exam        Assessment & Plan:

## 2020-04-21 ENCOUNTER — Other Ambulatory Visit: Payer: Self-pay | Admitting: Nurse Practitioner

## 2020-05-13 ENCOUNTER — Other Ambulatory Visit: Payer: Self-pay | Admitting: Nurse Practitioner

## 2020-05-22 ENCOUNTER — Other Ambulatory Visit: Payer: Self-pay | Admitting: Nurse Practitioner

## 2020-06-08 ENCOUNTER — Ambulatory Visit: Payer: 59 | Admitting: Family Medicine

## 2020-06-08 ENCOUNTER — Other Ambulatory Visit: Payer: Self-pay

## 2020-06-08 ENCOUNTER — Encounter: Payer: Self-pay | Admitting: Family Medicine

## 2020-06-08 VITALS — BP 116/74 | HR 95 | Temp 97.0°F | Ht 70.0 in | Wt 204.4 lb

## 2020-06-08 DIAGNOSIS — R1030 Lower abdominal pain, unspecified: Secondary | ICD-10-CM | POA: Insufficient documentation

## 2020-06-08 DIAGNOSIS — R1031 Right lower quadrant pain: Secondary | ICD-10-CM | POA: Diagnosis not present

## 2020-06-08 LAB — POCT URINALYSIS DIPSTICK
Spec Grav, UA: 1.02 (ref 1.010–1.025)
pH, UA: 6 (ref 5.0–8.0)

## 2020-06-08 NOTE — Progress Notes (Signed)
Patient ID: Eileen Nunez, female    DOB: 09/24/1980, 39 y.o.   MRN: [id number]   Chief Complaint  Patient presents with  . Abdominal Pain    Cramping spells- has happened 3 times in 6 months- happened a few days ago- she had had bagel seasoning last 2 times- Not sure if that is the cause-? Diverticulitis- also sore in RLQ   Subjective:  CC: chronic abdominal pain x 6 months  This is not a new problem.  Reports today with a complaint of generalized abdominal pain has been present for 6 months.  Associated symptoms include bloating, cramping, tender to touch, during episodes of pain.  The pain is not constant, it does come and go.  Also points to pain on the right side of the abdomen and back pain.  Also reports pain during intercourse this is a new finding.  Her last Pap smear was July 29, 2012.  She has been going through a lot since Oct 17, 2012, her husband passed away in 10/18/14 and time got away from her.  She denies fever, chills, however she is not certain because she routinely takes ibuprofen or Tylenol and this could mask a fever.  She does report that this pain wakes her up at night, she has difficulty getting comfortable and was unable to lay on her right side last night.  She does not appear in any distress, or acutely ill today.    Medical History Camdyn has a past medical history of Anxiety, Diabetes mellitus without complication (HCC), Hypertension, and Menorrhagia (09/07/2013).   Outpatient Encounter Medications as of 06/08/2020  Medication Sig  . buPROPion (WELLBUTRIN XL) 300 MG 24 hr tablet TAKE 1 TABLET(300 MG) BY MOUTH DAILY  . levothyroxine (SYNTHROID) 75 MCG tablet Take 1 tablet (75 mcg total) by mouth daily.  Marland Kitchen lisinopril (ZESTRIL) 5 MG tablet Take 1 tablet (5 mg total) by mouth daily.  . Multiple Vitamin (MULTIVITAMIN) tablet Take by mouth daily. gummies-2 daily  . QSYMIA 15-92 MG CP24 TAKE 1 CAPSULE BY MOUTH EVERY DAY  . triamterene-hydrochlorothiazide (MAXZIDE) 75-50 MG  tablet TAKE 1 TABLET BY MOUTH DAILY   No facility-administered encounter medications on file as of 06/08/2020.     Review of Systems  Constitutional: Negative for chills and fever.  Respiratory: Negative for shortness of breath.   Cardiovascular: Negative for chest pain.  Gastrointestinal: Positive for abdominal distention, abdominal pain and diarrhea. Negative for nausea and vomiting.  Genitourinary: Positive for dyspareunia. Negative for vaginal bleeding, vaginal discharge and vaginal pain.     Vitals BP 116/74   Pulse 95   Temp (!) 97 F (36.1 C) (Oral)   Ht 5\' 10"  (1.778 m)   Wt 204 lb 6.4 oz (92.7 kg)   SpO2 100%   BMI 29.33 kg/m   Objective:   Physical Exam Vitals and nursing note reviewed.  Constitutional:      General: She is not in acute distress.    Appearance: Normal appearance.  Cardiovascular:     Rate and Rhythm: Normal rate and regular rhythm.     Heart sounds: Normal heart sounds.  Pulmonary:     Effort: Pulmonary effort is normal.     Breath sounds: Normal breath sounds.  Abdominal:     General: Bowel sounds are normal.     Tenderness: There is abdominal tenderness in the right lower quadrant. There is right CVA tenderness. There is no left CVA tenderness, guarding or rebound. Negative signs include Murphy's sign and Rovsing's  sign.  Skin:    General: Skin is warm and dry.  Neurological:     General: No focal deficit present.     Mental Status: She is alert.  Psychiatric:        Behavior: Behavior normal.    Results for orders placed or performed in visit on 06/08/20  POCT urinalysis dipstick  Result Value Ref Range   Color, UA     Clarity, UA     Glucose, UA     Bilirubin, UA     Ketones, UA     Spec Grav, UA 1.020 1.010 - 1.025   Blood, UA     pH, UA 6.0 5.0 - 8.0   Protein, UA     Urobilinogen, UA     Nitrite, UA     Leukocytes, UA     Appearance     Odor       Assessment and Plan   1. Right lower quadrant abdominal pain -  Comprehensive Metabolic Panel (CMET) - CBC with Differential - Amylase - Lipase - Ambulatory referral to Obstetrics / Gynecology - US Abdomen Complete - POCT urinalysis dipstick   Concerning for abdominal pathology and/or pelvic pathology.  Will make referral for GYN, [facility] for assessment and Pap smear.  She will go today on the way out and get lab work and we will schedule an abdominal ultrasound.  Will notify of results once they become available.  Agrees with plan of care discussed today. Understands warning signs to seek further care: Fever, chills, rigors, any change in the pain, report to the emergency room immediately. Understands to follow-up in 1 month at this office, depending on lab results, this follow-up could change, see GYN as soon as possible.   Dorena Bodo, FNP-C

## 2020-06-08 NOTE — Patient Instructions (Signed)

## 2020-06-09 LAB — CBC WITH DIFFERENTIAL/PLATELET
Basophils Absolute: 0.1 10*3/uL (ref 0.0–0.2)
Basos: 1 %
EOS (ABSOLUTE): 0.1 10*3/uL (ref 0.0–0.4)
Eos: 1 %
Hematocrit: 38 % (ref 34.0–46.6)
Hemoglobin: 13 g/dL (ref 11.1–15.9)
Immature Grans (Abs): 0.2 10*3/uL — ABNORMAL HIGH (ref 0.0–0.1)
Immature Granulocytes: 2 %
Lymphocytes Absolute: 1.7 10*3/uL (ref 0.7–3.1)
Lymphs: 15 %
MCH: 31.2 pg (ref 26.6–33.0)
MCHC: 34.2 g/dL (ref 31.5–35.7)
MCV: 91 fL (ref 79–97)
Monocytes Absolute: 0.7 10*3/uL (ref 0.1–0.9)
Monocytes: 6 %
Neutrophils Absolute: 8.6 10*3/uL — ABNORMAL HIGH (ref 1.4–7.0)
Neutrophils: 75 %
Platelets: 368 10*3/uL (ref 150–450)
RBC: 4.17 x10E6/uL (ref 3.77–5.28)
RDW: 12.8 % (ref 11.7–15.4)
WBC: 11.4 10*3/uL — ABNORMAL HIGH (ref 3.4–10.8)

## 2020-06-09 LAB — COMPREHENSIVE METABOLIC PANEL
ALT: 49 IU/L — ABNORMAL HIGH (ref 0–32)
AST: 27 IU/L (ref 0–40)
Albumin/Globulin Ratio: 1.3 (ref 1.2–2.2)
Albumin: 4 g/dL (ref 3.8–4.8)
Alkaline Phosphatase: 78 IU/L (ref 44–121)
BUN/Creatinine Ratio: 13 (ref 9–23)
BUN: 15 mg/dL (ref 6–24)
Bilirubin Total: 0.8 mg/dL (ref 0.0–1.2)
CO2: 23 mmol/L (ref 20–29)
Calcium: 9.1 mg/dL (ref 8.7–10.2)
Chloride: 97 mmol/L (ref 96–106)
Creatinine, Ser: 1.2 mg/dL — ABNORMAL HIGH (ref 0.57–1.00)
GFR calc Af Amer: 65 mL/min/{1.73_m2} (ref >59)
GFR calc non Af Amer: 56 mL/min/{1.73_m2} — ABNORMAL LOW (ref >59)
Globulin, Total: 3 g/dL (ref 1.5–4.5)
Glucose: 84 mg/dL (ref 65–99)
Potassium: 3.5 mmol/L (ref 3.5–5.2)
Sodium: 137 mmol/L (ref 134–144)
Total Protein: 7 g/dL (ref 6.0–8.5)

## 2020-06-09 LAB — AMYLASE: Amylase: 101 U/L (ref 31–110)

## 2020-06-09 LAB — LIPASE: Lipase: 46 U/L (ref 14–72)

## 2020-06-12 ENCOUNTER — Telehealth: Payer: Self-pay | Admitting: Family Medicine

## 2020-06-20 ENCOUNTER — Ambulatory Visit (HOSPITAL_COMMUNITY)
Admission: RE | Admit: 2020-06-20 | Discharge: 2020-06-20 | Disposition: A | Discharge status: Home / Self Care | Payer: 59 | Source: Ambulatory Visit | Attending: Family Medicine | Admitting: Family Medicine

## 2020-06-20 ENCOUNTER — Other Ambulatory Visit: Payer: Self-pay

## 2020-06-20 DIAGNOSIS — R1031 Right lower quadrant pain: Secondary | ICD-10-CM | POA: Insufficient documentation

## 2020-06-21 ENCOUNTER — Other Ambulatory Visit: Payer: Self-pay | Admitting: Family Medicine

## 2020-06-21 DIAGNOSIS — K8020 Calculus of gallbladder without cholecystitis without obstruction: Secondary | ICD-10-CM

## 2020-06-25 ENCOUNTER — Other Ambulatory Visit: Payer: Self-pay | Admitting: *Deleted

## 2020-06-25 DIAGNOSIS — N289 Disorder of kidney and ureter, unspecified: Secondary | ICD-10-CM

## 2020-07-02 ENCOUNTER — Other Ambulatory Visit: Payer: Self-pay | Admitting: Nurse Practitioner

## 2020-07-11 ENCOUNTER — Other Ambulatory Visit: Payer: Self-pay | Admitting: Nurse Practitioner

## 2020-07-11 NOTE — Telephone Encounter (Signed)
I will send in a refill of her thyroid medication. She is due for her repeat TSH to make sure this is the right dose. Thanks.

## 2020-07-17 ENCOUNTER — Encounter: Payer: Self-pay | Admitting: General Surgery

## 2020-07-17 ENCOUNTER — Other Ambulatory Visit: Payer: Self-pay

## 2020-07-17 ENCOUNTER — Ambulatory Visit (INDEPENDENT_AMBULATORY_CARE_PROVIDER_SITE_OTHER): Payer: 59 | Admitting: General Surgery

## 2020-07-17 VITALS — BP 119/74 | HR 88 | Temp 98.2°F | Resp 16 | Ht 70.0 in | Wt 206.0 lb

## 2020-07-17 DIAGNOSIS — R1030 Lower abdominal pain, unspecified: Secondary | ICD-10-CM | POA: Diagnosis not present

## 2020-07-17 DIAGNOSIS — K8020 Calculus of gallbladder without cholecystitis without obstruction: Secondary | ICD-10-CM | POA: Diagnosis not present

## 2020-07-17 NOTE — Progress Notes (Signed)
[facility] History and Physical  Reason for Referral: Abdominal pain, Gallstones  Referring Physician:  Valla Leaver NP  Chief Complaint    New Patient (Initial Visit)      Eileen Nunez is a 40 y.o. female.  HPI: Eileen Nunez is a 40 yo who reports having generalized abdominal pain with more pain the right lower abdomen with palpation. She says that she has woken up in the middle of the night and had pain that radiated to her right flank and lower abdomen and into her shoulder. She has had this pain on and off for several months and worsening episodes. She says that the last time it did occur after she ate some bagel seasoning but does not necessarily report correlation with greasy or fatty foods as she tries to avoid these normally. She has noticed some bloating and says her abdomen is so tender to the touch after she has an episode that she has to lie in a fetal position and not move.    She denies any blood BMs and says she is pretty regular.  She also is reporting pain with intercourse and was worked up by her PCP with plans for Gyn referral and US of the abdomen.  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  She sees the [place] in February.   Past Medical History:  Diagnosis Date  . Anxiety   . Diabetes mellitus without complication (HCC)    gestational diabetes  . Hypertension    during pregnancy  . Menorrhagia 09/07/2013   Has period about every 3 weeks and has cramp too    Past Surgical History:  Procedure Laterality Date  . CESAREAN SECTION     x 2  . DILITATION & CURRETTAGE/HYSTROSCOPY WITH THERMACHOICE ABLATION N/A 09/20/2013   Procedure: DILATATION & CURETTAGE/HYSTEROSCOPY WITH THERMACHOICE ABLATION;  Surgeon: [name], MD;  Location: [facility];  Service: Gynecology;  Laterality: N/A;  total therapy time: 9 minutes 30 seconds;  temperature:  87 degrees   . TUBAL LIGATION      Family History  Problem Relation  Age of Onset  . Cancer Father        liver  . Heart disease Maternal Grandmother   . Anuerysm Paternal Grandfather        stomach    Social History   Tobacco Use  . Smoking status: Former Smoker    Packs/day: 0.50    Years: 20.00    Pack years: 10.00    Types: Cigarettes    Start date: 09/21/1994    Quit date: 02/22/2016    Years since quitting: 4.4  . Smokeless tobacco: Never Used  Substance Use Topics  . Alcohol use: Yes    Alcohol/week: 2.0 - 3.0 standard drinks    Types: 2 - 3 Standard drinks or equivalent per week    Comment: rare  . Drug use: No    Medications: I have reviewed the patient's current medications. Allergies as of 07/17/2020      Reactions   Celexa [citalopram Hydrobromide]    Bloating,decreased libido      Medication List       Accurate as of July 17, 2020  2:03 PM. If you have any questions, ask your nurse or doctor.        buPROPion 300 MG 24 hr tablet Commonly known as: WELLBUTRIN XL TAKE 1 TABLET(300 MG) BY MOUTH DAILY   levothyroxine 75 MCG tablet Commonly known as: SYNTHROID TAKE 1 TABLET(75  MCG) BY MOUTH DAILY   lisinopril 5 MG tablet Commonly known as: ZESTRIL TAKE 1 TABLET(5 MG) BY MOUTH DAILY   multivitamin tablet Take by mouth daily. gummies-2 daily   Qsymia 15-92 MG Cp24 Generic drug: Phentermine-Topiramate TAKE 1 CAPSULE BY MOUTH EVERY DAY   triamterene-hydrochlorothiazide 75-50 MG tablet Commonly known as: MAXZIDE TAKE 1 TABLET BY MOUTH DAILY        ROS:  A comprehensive review of systems was negative except for: Gastrointestinal: positive for abdominal pain  Blood pressure 119/74, pulse 88, temperature 98.2 F (36.8 C), temperature source Other (Comment), resp. rate 16, height 5\' 10"  (1.778 m), weight 206 lb (93.4 kg), SpO2 99 %. Physical Exam Vitals reviewed.  Constitutional:      Appearance: Normal appearance.  HENT:     Head: Normocephalic.     Nose: Nose normal.  Eyes:     Extraocular Movements:  Extraocular movements intact.  Cardiovascular:     Rate and Rhythm: Normal rate and regular rhythm.  Pulmonary:     Effort: Pulmonary effort is normal.     Breath sounds: Normal breath sounds.  Abdominal:     General: There is no distension.     Palpations: Abdomen is soft.     Tenderness: There is abdominal tenderness in the right lower quadrant.     Comments: Minor right flank tenderness, more right lower quadrant tenderness   Musculoskeletal:        General: Normal range of motion.     Cervical back: Normal range of motion.  Skin:    General: Skin is warm.  Neurological:     General: No focal deficit present.     Mental Status: She is alert and oriented to person, place, and time.  Psychiatric:        Mood and Affect: Mood normal.        Behavior: Behavior normal.        Thought Content: Thought content normal.        Judgment: Judgment normal.     Results: CLINICAL DATA:  RIGHT abdominal pain for 1 month.  EXAM: ABDOMEN ULTRASOUND COMPLETE  COMPARISON:  None.  FINDINGS: Gallbladder: Gallbladder with wall-echo-shadow sign indicating a gallbladder filled with stones. No pericholecystic fluid. No sonographic Murphy's sign was elicited during the exam.  Common bile duct: Diameter: 5 mm  Liver: Liver is diffusely echogenic indicating fatty infiltration. No focal liver abnormality is identified. Portal vein is patent on color Doppler imaging with normal direction of blood flow towards the liver.  IVC: No abnormality visualized.  Pancreas: Visualized portion unremarkable.  Spleen: Size and appearance within normal limits.  Right Kidney: Length: 11.5 cm. Renal cortex echogenicity is within normal limits. Renal pyramids are at least mildly echogenic throughout. No hydronephrosis.  Left Kidney: Length: 12.4 cm. Renal cortex echogenicity is within normal limits. Renal pyramids are at least mildly echogenic throughout. No hydronephrosis.  Abdominal  aorta: No aneurysm visualized.  Other findings: None.  IMPRESSION: 1. Gallbladder appears to be filled with stones. No secondary signs of acute cholecystitis at this time. 2. No bile duct dilatation. 3. Fatty infiltration of the liver. 4. 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 Kidneys otherwise unremarkable. No hydronephrosis.   Electronically Signed   By: M.D.   On: 06/20/2020 13:16  Assessment & Plan:  ELLIANA BAL is a 41 y.o. female with abdominal pain that is lower than her gallbladder with palpation and some symptoms that could  reflect gallbladder given the pain and bloating but are not definitive. She has had a [place] but no further workup. She is seeing Gyn in the upcoming weeks too for painful sex.   Given the lower abdominal pain and constellation of complaints. I offered the option of proceeding with a CT and monitoring for a few more weeks to see if we think that this is truly gallbladder or not. She will get her Ct and see Gyn in the meantime.   Future Appointments  Date Time Provider Department Center  07/31/2020 10:30 AM [name], MD [facility] None  08/10/2020  9:30 AM Adline Potter, NP [facility]    All questions were answered to the satisfaction of the patient.   [name] 07/17/2020, 2:03 PM

## 2020-07-17 NOTE — Patient Instructions (Addendum)
Will order CT and further assess for causes of abdominal pain.  See GYN Will follow up in a few weeks   Cholelithiasis  Cholelithiasis happens when gallstones form in the gallbladder. The gallbladder stores bile. Bile is a fluid that helps digest fats. Bile can harden and form into gallstones. If they cause a blockage, they can cause pain (gallbladder attack). What are the causes? This condition may be caused by:  Some blood diseases, such as sickle cell anemia.  Too much of a fat-like substance (cholesterol) in your bile.  Not enough bile salts in your bile. These salts help the body absorb and digest fats.  The gallbladder not emptying fully or often enough. This is common in pregnant women. What increases the risk? The following factors may make you more likely to develop this condition:  Being female.  Being pregnant many times.  Eating a lot of fried foods, fat, and refined carbs (refined carbohydrates).  Being very overweight (obese).  Being older than age 68.  Using medicines with female hormones in them for a long time.  Losing weight fast.  Having gallstones in your family.  Having some health problems, such as diabetes, Crohn's disease, or liver disease. What are the signs or symptoms? Often, there may be gallstones but no symptoms. These gallstones are called silent gallstones. If a gallstone causes a blockage, you may get sudden pain. The pain:  Can be in the upper right part of your belly (abdomen).  Normally comes at night or after you eat.  Can last an hour or more.  Can spread to your right shoulder, back, or chest.  Can feel like discomfort, burning, or fullness in the upper part of your belly (indigestion). If the blockage lasts more than a few hours, you can get an infection or swelling. You may:  Feel like you may vomit.  Vomit.  Feel bloated.  Have belly pain for 5 hours or more.  Feel tender in your belly, often in the upper right part  and under your ribs.  Have fever or chills.  Have skin or the white parts of your eyes turn yellow (jaundice).  Have dark pee (urine) or pale poop (stool). How is this treated? Treatment for this condition depends on how bad you feel. If you have symptoms, you may need:  Home care, if symptoms are not very bad. ? Do not eat for 12-24 hours. Drink only water and clear liquids. ? Start to eat simple or clear foods after 1 or 2 days. Try broths and crackers. ? You may need medicines for pain or stomach upset or both. ? If you have an infection, you will need antibiotics.  A hospital stay, if you have very bad pain or a very bad infection.  Surgery to remove your gallbladder. You may need this if: ? Gallstones keep coming back. ? You have very bad symptoms.  Medicines to break up gallstones. Medicines: ? Are best for small gallstones. ? May be used for up to 6-12 months.  A procedure to find and take out gallstones or to break up gallstones. Follow these instructions at home: Medicines  Take over-the-counter and prescription medicines only as told by your doctor.  If you were prescribed an antibiotic medicine, take it as told by your doctor. Do not stop taking the antibiotic even if you start to feel better.  Ask your doctor if the medicine prescribed to you requires you to avoid driving or using machinery. Eating and drinking  Drink enough fluid to keep your urine pale yellow. Drink water or clear fluids. This is important when you have pain.  Eat healthy foods. Choose: ? Fewer fatty foods, such as fried foods. ? Fewer refined carbs. Avoid breads and grains that are highly processed, such as white bread and white rice. Choose whole grains, such as whole-wheat bread and brown rice. ? More fiber. Almonds, fresh fruit, and beans are healthy sources. General instructions  Keep a healthy weight.  Keep all follow-up visits as told by your doctor. This is important. Where to  find more information  [employer] of Diabetes and Digestive and Kidney Diseases: CarFli[employer]tn Contact a doctor if:  You have sudden pain in the upper right part of your belly. Pain might spread to your right shoulder, back, or chest.  You have been diagnosed with gallstones that have no symptoms and you get: ? Belly pain. ? Discomfort, burning, or fullness in the upper part of your abdomen.  You have dark urine or pale stools. Get help right away if:  You have sudden pain in the upper right part of your abdomen, and the pain lasts more than 2 hours.  You have pain in your abdomen, and: ? It lasts more than 5 hours. ? It keeps getting worse.  You have a fever or chills.  You keep feeling like you may vomit.  You keep vomiting.  Your skin or the white parts of your eyes turn yellow. Summary  Cholelithiasis happens when gallstones form in the gallbladder.  This condition may be caused by a blood disease, too much of a fat-like substance in the bile, or not enough bile salts in bile.  Treatment for this condition depends on how bad you feel.  If you have symptoms, do not eat or drink. You may need medicines. You may need a hospital stay for very bad pain or a very bad infection.  You may need surgery if gallstones keep coming back or if you have very bad symptoms. This information is not intended to replace advice given to you by your health care provider. Make sure you discuss any questions you have with your health care provider. Document Revised: 07/29/2019 Document Reviewed: 05/02/2019 Elsevier Patient Education  2021 Elsevier Inc.   Minimally Invasive Cholecystectomy Minimally invasive cholecystectomy is surgery to remove the gallbladder. The gallbladder is a pear-shaped organ that lies beneath the liver on the right side of the body. The gallbladder stores bile, which is a fluid that helps the body digest fats. Cholecystectomy is often done to treat  inflammation of the gallbladder (cholecystitis). This condition is usually caused by a buildup of gallstones (cholelithiasis) in the gallbladder. Gallstones can block the flow of bile, which can result in inflammation and pain. In severe cases, emergency surgery may be required. This procedure is done though small incisions in the abdomen, instead of one large incision. It is also called laparoscopic surgery. A thin scope with a camera (laparoscope) is inserted through one incision. Then surgical instruments are inserted through the other incisions. In some cases, a minimally invasive surgery may need to be changed to a surgery that is done through a larger incision. This is called open surgery. Tell a health care provider about:  Any allergies you have.  All medicines you are taking, including vitamins, herbs, eye drops, creams, and over-the-counter medicines.  Any problems you or family members have had with anesthetic medicines.  Any blood disorders you have.  Any surgeries you have had.  Any medical conditions you have.  Whether you are pregnant or may be pregnant. What are the risks? Generally, this is a safe procedure. However, problems may occur, including:  Infection.  Bleeding.  Allergic reactions to medicines.  Damage to nearby structures or organs.  A stone remaining in the common bile duct. The common bile duct carries bile from the gallbladder into the small intestine.  A bile leak from the cyst duct that is clipped when your gallbladder is removed. Ask your health care provider about:  Changing or stopping your regular medicines. This is especially important if you are taking diabetes medicines or blood thinners.  Taking medicines such as aspirin and ibuprofen. These medicines can thin your blood. Do not take these medicines unless your health care provider tells you to take them.  Taking over-the-counter medicines, vitamins, herbs, and supplements. General  instructions  Let your health care provider know if you develop a cold or an infection before surgery.  Plan to have someone take you home from the hospital or clinic.  If you will be going home right after the procedure, plan to have someone with you for 24 hours.  Ask your health care provider: ? How your surgery site will be marked. ? What steps will be taken to help prevent infection. These may include:  Removing hair at the surgery site.  Washing skin with a germ-killing soap.  Taking antibiotic medicine. What happens during the procedure?  An IV will be inserted into one of your veins.  You will be given one or both of the following: ? A medicine to help you relax (sedative). ? A medicine to make you fall asleep (general anesthetic).  A breathing tube will be placed in your mouth.  Your surgeon will make several small incisions in your abdomen.  The laparoscope will be inserted through one of the small incisions. The camera on the laparoscope will send images to a monitor in the operating room. This lets your surgeon see inside your abdomen.  A gas will be pumped into your abdomen. This will expand your abdomen to give the surgeon more room to perform the surgery.  Other tools that are needed for the procedure will be inserted through the other incisions. The gallbladder will be removed through one of the incisions.  Your common bile duct may be examined. If stones are found in the common bile duct, they may be removed.  After your gallbladder has been removed, the incisions will be closed with stitches (sutures), staples, or skin glue.  Your incisions may be covered with a bandage (dressing). The procedure may vary among health care providers and hospitals.   What happens after the procedure?  Your blood pressure, heart rate, breathing rate, and blood oxygen level will be monitored until you leave the hospital or clinic.  You will be given medicines as needed to  control your pain.  If you were given a sedative during the procedure, it can affect you for several hours. Do not drive or operate machinery until your health care provider says that it is safe. Summary  Minimally invasive cholecystectomy, also called laparoscopic cholecystectomy, is surgery to remove the gallbladder using small incisions.  Tell your health care provider about all the medical conditions you have and all the medicines you are taking for those conditions.  Before the procedure, follow instructions about eating or drinking restrictions and changing or stopping medicines.  If you were given a sedative during the procedure, it can affect  you for several hours. Do not drive or operate machinery until your health care provider says that it is safe. This information is not intended to replace advice given to you by your health care provider. Make sure you discuss any questions you have with your health care provider. Document Revised: 03/14/2019 Document Reviewed: 03/14/2019 Elsevier Patient Education  [place].

## 2020-07-23 ENCOUNTER — Telehealth (INDEPENDENT_AMBULATORY_CARE_PROVIDER_SITE_OTHER): Payer: 59 | Admitting: General Surgery

## 2020-07-23 DIAGNOSIS — R1030 Lower abdominal pain, unspecified: Secondary | ICD-10-CM

## 2020-07-23 NOTE — Telephone Encounter (Signed)
[facility]  CT a/p requested. [facility] Peer to peer done to get the pelvis part approved. Patient having lower abdominal and some pain with sex and [place] was unremarkable expect for gallstones. Symptoms are not correlating with pain due to the lower abdominal pain.  She could have diverticulitis, chronic appendicitis, she could have something going on with her ovaries.  CT a/p Authorization # [id number] Valid Jan 28-Aug 18, 2020.   Algis Greenhouse, MD [facility] [address] [place], [place] [address] [phone] (office)

## 2020-07-31 ENCOUNTER — Ambulatory Visit: Payer: 59 | Admitting: General Surgery

## 2020-08-07 ENCOUNTER — Telehealth: Payer: Self-pay | Admitting: Nurse Practitioner

## 2020-08-07 NOTE — Telephone Encounter (Signed)
Pharmacy requesting refill on Qsymia 15-92 mg capsules. Take one capsule po daily. Pt last seen 06/08/20 for RLQ. Please advise. Thank you

## 2020-08-08 ENCOUNTER — Other Ambulatory Visit: Payer: Self-pay | Admitting: Nurse Practitioner

## 2020-08-08 MED ORDER — QSYMIA 15-92 MG PO CP24: 1.0000 | ORAL_CAPSULE | Freq: Every day | ORAL | 0 refills | Status: DC

## 2020-08-08 NOTE — Telephone Encounter (Signed)
Just sent in one month refill. Needs follow up appointment in March (3 month visit). Thanks.

## 2020-08-08 NOTE — Telephone Encounter (Signed)
Please contact patient to have her set up appt in March. Thank you!

## 2020-08-10 ENCOUNTER — Encounter: Payer: Self-pay | Admitting: Adult Health

## 2020-08-10 ENCOUNTER — Other Ambulatory Visit: Payer: Self-pay

## 2020-08-10 ENCOUNTER — Ambulatory Visit (INDEPENDENT_AMBULATORY_CARE_PROVIDER_SITE_OTHER): Payer: 59 | Admitting: Adult Health

## 2020-08-10 ENCOUNTER — Other Ambulatory Visit (HOSPITAL_COMMUNITY)
Admission: RE | Admit: 2020-08-10 | Discharge: 2020-08-10 | Disposition: A | Discharge status: Home / Self Care | Payer: 59 | Source: Ambulatory Visit | Attending: Adult Health | Admitting: Adult Health

## 2020-08-10 VITALS — BP 113/78 | HR 98 | Ht 70.0 in | Wt 207.0 lb

## 2020-08-10 DIAGNOSIS — Z01419 Encounter for gynecological examination (general) (routine) without abnormal findings: Secondary | ICD-10-CM | POA: Insufficient documentation

## 2020-08-10 DIAGNOSIS — Z1211 Encounter for screening for malignant neoplasm of colon: Secondary | ICD-10-CM | POA: Diagnosis not present

## 2020-08-10 DIAGNOSIS — Z1231 Encounter for screening mammogram for malignant neoplasm of breast: Secondary | ICD-10-CM | POA: Diagnosis not present

## 2020-08-10 LAB — HEMOCCULT GUIAC POC 1CARD (OFFICE): Fecal Occult Blood, POC: NEGATIVE

## 2020-08-10 NOTE — Progress Notes (Signed)
Patient ID: Eileen Nunez, female   DOB: 1980-11-27, 40 y.o.   MRN: [id number] History of Present Illness:  Eileen Nunez is a 40 year old white female, married, G2P 2 in for a well woman gyn exam and pap. Last pap was 2014 and normal with negative HRHPV.   Current Medications, Allergies, Past Medical History, Past Surgical History, Family History and Social History were reviewed in [employer] record.     Review of Systems: Patient denies any headaches, hearing loss, fatigue, blurred vision, shortness of breath, chest pain,  problems with bowel movements, urination, or intercourse. No joint pain or mood swings. Spots about 2 days every month, sp ablation Has been having abdominal pain, has gallstones and having CT Monday to rule diverticulosis, she has seen Dr Henreitta Leber and has follow up with her     Physical Exam:BP 113/78 (BP Location: Left Arm, Patient Position: Sitting, Cuff Size: Large)   Pulse 98   Ht 5\' 10"  (1.778 m)   Wt 207 lb (93.9 kg)   LMP 08/01/2020 (Approximate)   BMI 29.70 kg/m  General:  Well developed, well nourished, no acute distress Skin:  Warm and dry Neck:  Midline trachea, normal thyroid, good ROM, no lymphadenopathy Lungs; Clear to auscultation bilaterally Breast:  No dominant palpable mass, retraction, or nipple discharge Cardiovascular: Regular rate and rhythm Abdomen:  Soft, non tender, no hepatosplenomegaly Pelvic:  External genitalia is normal in appearance, no lesions.  The vagina is normal in appearance. Urethra has no lesions or masses. The cervix is smooth, pap with HR HPV genotyping performed.  Uterus is felt to be normal size, shape, and contour.  No adnexal masses or tenderness noted.Bladder is non tender, no masses felt. Rectal: Good sphincter tone, no polyps, or hemorrhoids felt.  Hemoccult negative. Extremities/musculoskeletal:  No swelling or varicosities noted, no clubbing or cyanosis Psych:  No mood changes, alert and  cooperative,seems happy AA is 4 Fall risk is low PHQ 9 score is 1 GAD 7 score is 1  [facility] - 08/10/20 0941      Pregnancy Intention Screening   Does the patient want to become pregnant in the next year? No    Does the patient's partner want to become pregnant in the next year? No    Would the patient like to discuss contraceptive options today? No      Contraception Wrap Up   Current Method Female Sterilization    End Method Female Sterilization    Contraception Counseling Provided No         Examination chaperoned by 08/12/20 LPN  Impression and Plan: 1. Encounter for gynecological examination with Papanicolaou smear of cervix Pap sent Physical in 1 year Pap in 3 if normal  2. Encounter for screening fecal occult blood testing   3. Screening mammogram for breast cancer First screening  Mammogram is scheduled for 08/15/20 at 9:15 am at [facility]

## 2020-08-13 ENCOUNTER — Other Ambulatory Visit: Payer: Self-pay

## 2020-08-13 ENCOUNTER — Ambulatory Visit (HOSPITAL_COMMUNITY)
Admission: RE | Admit: 2020-08-13 | Discharge: 2020-08-13 | Disposition: A | Discharge status: Home / Self Care | Payer: 59 | Source: Ambulatory Visit | Attending: General Surgery | Admitting: General Surgery

## 2020-08-13 DIAGNOSIS — R1030 Lower abdominal pain, unspecified: Secondary | ICD-10-CM | POA: Insufficient documentation

## 2020-08-13 LAB — POCT I-STAT CREATININE: Creatinine, Ser: 1.1 mg/dL — ABNORMAL HIGH (ref 0.44–1.00)

## 2020-08-13 MED ORDER — IOHEXOL 300 MG/ML  SOLN
100.0000 mL | Freq: Once | INTRAMUSCULAR | Status: AC | PRN
  Administered 2020-08-13: 100 mL via INTRAVENOUS

## 2020-08-13 NOTE — Telephone Encounter (Signed)
Med Check appt made for 03./04

## 2020-08-14 ENCOUNTER — Encounter: Payer: Self-pay | Admitting: Nurse Practitioner

## 2020-08-15 ENCOUNTER — Ambulatory Visit (HOSPITAL_COMMUNITY)
Admission: RE | Admit: 2020-08-15 | Discharge: 2020-08-15 | Disposition: A | Discharge status: Home / Self Care | Payer: 59 | Source: Ambulatory Visit | Attending: Adult Health | Admitting: Adult Health

## 2020-08-15 ENCOUNTER — Other Ambulatory Visit: Payer: Self-pay | Admitting: Nurse Practitioner

## 2020-08-15 ENCOUNTER — Other Ambulatory Visit: Payer: Self-pay

## 2020-08-15 DIAGNOSIS — Z1231 Encounter for screening mammogram for malignant neoplasm of breast: Secondary | ICD-10-CM | POA: Diagnosis not present

## 2020-08-15 LAB — CYTOLOGY - PAP
Adequacy: ABSENT
Comment: NEGATIVE
Diagnosis: NEGATIVE
High risk HPV: NEGATIVE

## 2020-08-15 MED ORDER — QSYMIA 15-92 MG PO CP24: 1.0000 | ORAL_CAPSULE | Freq: Every day | ORAL | 0 refills | Status: DC

## 2020-08-16 ENCOUNTER — Encounter: Payer: Self-pay | Admitting: General Surgery

## 2020-08-16 ENCOUNTER — Ambulatory Visit (INDEPENDENT_AMBULATORY_CARE_PROVIDER_SITE_OTHER): Payer: 59 | Admitting: General Surgery

## 2020-08-16 VITALS — BP 110/77 | HR 101 | Temp 98.2°F | Resp 16 | Ht 70.0 in | Wt 211.0 lb

## 2020-08-16 DIAGNOSIS — K8020 Calculus of gallbladder without cholecystitis without obstruction: Secondary | ICD-10-CM

## 2020-08-16 NOTE — Progress Notes (Signed)
[facility] History and Physical  Reason for Referral: Gallstones   Chief Complaint    Follow-up      Eileen Nunez is a 40 y.o. female.  HPI: Ms.  Bortle is following up after being seen for gallstones and complaints of lower right sided pain. We decided that we would get a CT of her abdomen and pelvis to ensure we were not missing anything given that her pain was more in the lower abdomen. She says that she has been avoiding all grease foods and her bagel season and that she has not had another episode. Her CT demonstrated no evidence of a diverticulitis and her appendix was normal.  She is here to discuss the option of cholecystectomy. She has had some bloating and nausea on occasion.   Past Medical History:  Diagnosis Date  . Anxiety   . Diabetes mellitus without complication (HCC)    gestational diabetes  . Hypertension    during pregnancy  . Menorrhagia 09/07/2013   Has period about every 3 weeks and has cramp too    Past Surgical History:  Procedure Laterality Date  . CESAREAN SECTION     x 2  . DILITATION & CURRETTAGE/HYSTROSCOPY WITH THERMACHOICE ABLATION N/A 09/20/2013   Procedure: DILATATION & CURETTAGE/HYSTEROSCOPY WITH THERMACHOICE ABLATION;  Surgeon: [name], MD;  Location: [facility];  Service: Gynecology;  Laterality: N/A;  total therapy time: 9 minutes 30 seconds;  temperature:  87 degrees   . TUBAL LIGATION      Family History  Problem Relation Age of Onset  . Cancer Father        liver  . Heart disease Maternal Grandmother   . Anuerysm Paternal Grandfather        stomach    Social History   Tobacco Use  . Smoking status: Former Smoker    Packs/day: 0.50    Years: 20.00    Pack years: 10.00    Types: Cigarettes    Start date: 09/21/1994    Quit date: 02/22/2016    Years since quitting: 4.4  . Smokeless tobacco: Never Used  Vaping Use  . Vaping Use: Never used  Substance Use Topics  . Alcohol use: Yes    Alcohol/week: 2.0 -  3.0 standard drinks    Types: 2 - 3 Standard drinks or equivalent per week    Comment: rare  . Drug use: No    Medications: I have reviewed the patient's current medications. Allergies as of 08/16/2020      Reactions   Celexa [citalopram Hydrobromide]    Bloating,decreased libido      Medication List       Accurate as of August 16, 2020  1:56 PM. If you have any questions, ask your nurse or doctor.        buPROPion 300 MG 24 hr tablet Commonly known as: WELLBUTRIN XL TAKE 1 TABLET(300 MG) BY MOUTH DAILY   levothyroxine 75 MCG tablet Commonly known as: SYNTHROID TAKE 1 TABLET(75 MCG) BY MOUTH DAILY   lisinopril 5 MG tablet Commonly known as: ZESTRIL TAKE 1 TABLET(5 MG) BY MOUTH DAILY   multivitamin tablet Take by mouth daily. gummies-2 daily   Qsymia 15-92 MG Cp24 Generic drug: Phentermine-Topiramate Take 1 tablet by mouth daily.   triamterene-hydrochlorothiazide 75-50 MG tablet Commonly known as: MAXZIDE TAKE 1 TABLET BY MOUTH DAILY        ROS:  A comprehensive review of systems was negative except for: Gastrointestinal: positive for abdominal pain  and nausea  Blood pressure 110/77, pulse (!) 101, temperature 98.2 F (36.8 C), temperature source Other (Comment), resp. rate 16, height 5\' 10"  (1.778 m), weight 211 lb (95.7 kg), last menstrual period 08/01/2020, SpO2 94 %. Physical Exam Vitals reviewed.  Constitutional:      Appearance: Normal appearance.  HENT:     Head: Normocephalic.     Nose: Nose normal.     Mouth/Throat:     Mouth: Mucous membranes are moist.  Eyes:     Extraocular Movements: Extraocular movements intact.  Cardiovascular:     Rate and Rhythm: Normal rate and regular rhythm.  Pulmonary:     Effort: Pulmonary effort is normal.     Breath sounds: Normal breath sounds.  Abdominal:     General: There is no distension.     Palpations: Abdomen is soft.     Tenderness: There is abdominal tenderness in the right upper quadrant.   Musculoskeletal:        General: Normal range of motion.     Cervical back: Normal range of motion.  Skin:    General: Skin is warm.  Neurological:     General: No focal deficit present.     Mental Status: She is alert and oriented to person, place, and time.  Psychiatric:        Mood and Affect: Mood normal.        Behavior: Behavior normal.        Thought Content: Thought content normal.        Judgment: Judgment normal.     Results: CLINICAL DATA:  Right lower quadrant pain, lower abdominal pain  EXAM: CT ABDOMEN AND PELVIS WITH CONTRAST  TECHNIQUE: Multidetector CT imaging of the abdomen and pelvis was performed using the standard protocol following bolus administration of intravenous contrast.  CONTRAST:  09/29/2020 OMNIPAQUE IOHEXOL 300 MG/ML  SOLN  COMPARISON:  None.  FINDINGS: Lower chest: No acute abnormality.  Hepatobiliary: No focal liver abnormality. Gallbladder is contracted with a 1.5 cm stone present. No biliary dilatation.  Pancreas: Unremarkable.  Spleen: Unremarkable.  Adrenals/Urinary Tract: Adrenals are unremarkable. Too small to characterize low-attenuation renal lesions statistically reflecting cysts. No definite renal calculi. Bladder is unremarkable.  Stomach/Bowel: Stomach is within normal limits. Mild duodenal wall thickening likely due to underdistention. Bowel is normal in caliber. Appendix measures top normal at 7 mm. Oral contrast has not reached the cecum.  Vascular/Lymphatic: No significant vascular findings. No enlarged lymph nodes.  Reproductive: Uterus and bilateral adnexa are unremarkable.  Other: No ascites.  Abdominal wall is unremarkable.  Musculoskeletal: Lumbar spine degenerative changes. No acute osseous abnormality.  IMPRESSION: 1.5 cm gallstone.  Top-normal size appendix without secondary signs of acute appendicitis.   Electronically Signed   By: M.D.   On: 08/13/2020  16:41  CLINICAL DATA:  RIGHT abdominal pain for 1 month.  EXAM: ABDOMEN ULTRASOUND COMPLETE  COMPARISON:  None.  FINDINGS: Gallbladder: Gallbladder with wall-echo-shadow sign indicating a gallbladder filled with stones. No pericholecystic fluid. No sonographic Murphy's sign was elicited during the exam.  Common bile duct: Diameter: 5 mm  Liver: Liver is diffusely echogenic indicating fatty infiltration. No focal liver abnormality is identified. Portal vein is patent on color Doppler imaging with normal direction of blood flow towards the liver.  IVC: No abnormality visualized.  Pancreas: Visualized portion unremarkable.  Spleen: Size and appearance within normal limits.  Right Kidney: Length: 11.5 cm. Renal cortex echogenicity is within normal limits. Renal pyramids are at  least mildly echogenic throughout. No hydronephrosis.  Left Kidney: Length: 12.4 cm. Renal cortex echogenicity is within normal limits. Renal pyramids are at least mildly echogenic throughout. No hydronephrosis.  Abdominal aorta: No aneurysm visualized.  Other findings: None.  IMPRESSION: 1. Gallbladder appears to be filled with stones. No secondary signs of acute cholecystitis at this time. 2. No bile duct dilatation. 3. Fatty infiltration of the liver. 4. 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 Kidneys otherwise unremarkable. No hydronephrosis.   Electronically Signed   By: Bary Richard M.D.   On: 06/20/2020 13:16   Assessment & Plan:  RUCHA WISSINGER is a 40 y.o. female with gallstones in the gallbladder that is also contracted. She has a upper size of normal appendix without any inflammation. She is worried about having another attack.  -PLAN: I counseled the patient about the indication, risks and benefits of laparoscopic cholecystectomy.  She understands there is a very small chance for bleeding, infection,  injury to normal structures (including common bile duct), conversion to open surgery, persistent symptoms, evolution of postcholecystectomy diarrhea, need for secondary interventions, anesthesia reaction, cardiopulmonary issues and other risks not specifically detailed here. I described the expected recovery, the plan for follow-up and the restrictions during the recovery phase.  All questions were answered.  Discussed preop COVID testing.    All questions were answered to the satisfaction of the patient.    [name] 08/16/2020, 1:56 PM

## 2020-08-16 NOTE — Patient Instructions (Signed)
Cholelithiasis  Cholelithiasis happens when gallstones form in the gallbladder. The gallbladder stores bile. Bile is a fluid that helps digest fats. Bile can harden and form into gallstones. If they cause a blockage, they can cause pain (gallbladder attack). What are the causes? This condition may be caused by:  Some blood diseases, such as sickle cell anemia.  Too much of a fat-like substance (cholesterol) in your bile.  Not enough bile salts in your bile. These salts help the body absorb and digest fats.  The gallbladder not emptying fully or often enough. This is common in pregnant women. What increases the risk? The following factors may make you more likely to develop this condition:  Being female.  Being pregnant many times.  Eating a lot of fried foods, fat, and refined carbs (refined carbohydrates).  Being very overweight (obese).  Being older than age 40.  Using medicines with female hormones in them for a long time.  Losing weight fast.  Having gallstones in your family.  Having some health problems, such as diabetes, Crohn's disease, or liver disease. What are the signs or symptoms? Often, there may be gallstones but no symptoms. These gallstones are called silent gallstones. If a gallstone causes a blockage, you may get sudden pain. The pain:  Can be in the upper right part of your belly (abdomen).  Normally comes at night or after you eat.  Can last an hour or more.  Can spread to your right shoulder, back, or chest.  Can feel like discomfort, burning, or fullness in the upper part of your belly (indigestion). If the blockage lasts more than a few hours, you can get an infection or swelling. You may:  Feel like you may vomit.  Vomit.  Feel bloated.  Have belly pain for 5 hours or more.  Feel tender in your belly, often in the upper right part and under your ribs.  Have fever or chills.  Have skin or the white parts of your eyes turn yellow  (jaundice).  Have dark pee (urine) or pale poop (stool). How is this treated? Treatment for this condition depends on how bad you feel. If you have symptoms, you may need:  Home care, if symptoms are not very bad. ? Do not eat for 12-24 hours. Drink only water and clear liquids. ? Start to eat simple or clear foods after 1 or 2 days. Try broths and crackers. ? You may need medicines for pain or stomach upset or both. ? If you have an infection, you will need antibiotics.  A hospital stay, if you have very bad pain or a very bad infection.  Surgery to remove your gallbladder. You may need this if: ? Gallstones keep coming back. ? You have very bad symptoms.  Medicines to break up gallstones. Medicines: ? Are best for small gallstones. ? May be used for up to 6-12 months.  A procedure to find and take out gallstones or to break up gallstones. Follow these instructions at home: Medicines  Take over-the-counter and prescription medicines only as told by your doctor.  If you were prescribed an antibiotic medicine, take it as told by your doctor. Do not stop taking the antibiotic even if you start to feel better.  Ask your doctor if the medicine prescribed to you requires you to avoid driving or using machinery. Eating and drinking  Drink enough fluid to keep your urine pale yellow. Drink water or clear fluids. This is important when you have pain.  Eat   healthy foods. Choose: ? Fewer fatty foods, such as fried foods. ? Fewer refined carbs. Avoid breads and grains that are highly processed, such as white bread and white rice. Choose whole grains, such as whole-wheat bread and brown rice. ? More fiber. Almonds, fresh fruit, and beans are healthy sources. General instructions  Keep a healthy weight.  Keep all follow-up visits as told by your doctor. This is important. Where to find more information  National Institute of Diabetes and Digestive and Kidney Diseases:  www.niddk.nih.gov Contact a doctor if:  You have sudden pain in the upper right part of your belly. Pain might spread to your right shoulder, back, or chest.  You have been diagnosed with gallstones that have no symptoms and you get: ? Belly pain. ? Discomfort, burning, or fullness in the upper part of your abdomen.  You have dark urine or pale stools. Get help right away if:  You have sudden pain in the upper right part of your abdomen, and the pain lasts more than 2 hours.  You have pain in your abdomen, and: ? It lasts more than 5 hours. ? It keeps getting worse.  You have a fever or chills.  You keep feeling like you may vomit.  You keep vomiting.  Your skin or the white parts of your eyes turn yellow. Summary  Cholelithiasis happens when gallstones form in the gallbladder.  This condition may be caused by a blood disease, too much of a fat-like substance in the bile, or not enough bile salts in bile.  Treatment for this condition depends on how bad you feel.  If you have symptoms, do not eat or drink. You may need medicines. You may need a hospital stay for very bad pain or a very bad infection.  You may need surgery if gallstones keep coming back or if you have very bad symptoms. This information is not intended to replace advice given to you by your health care provider. Make sure you discuss any questions you have with your health care provider. Document Revised: 07/29/2019 Document Reviewed: 05/02/2019 Elsevier Patient Education  2021 Else[employer] Inc.   Minimally Invasive Cholecystectomy Minimally invasive cholecystectomy is surgery to remove the gallbladder. The gallbladder is a pear-shaped organ that lies beneath the liver on the right side of the body. The gallbladder stores bile, which is a fluid that helps the body digest fats. Cholecystectomy is often done to treat inflammation of the gallbladder (cholecystitis). This condition is usually caused by a buildup of  gallstones (cholelithiasis) in the gallbladder. Gallstones can block the flow of bile, which can result in inflammation and pain. In severe cases, emergency surgery may be required. This procedure is done though small incisions in the abdomen, instead of one large incision. It is also called laparoscopic surgery. A thin scope with a camera (laparoscope) is inserted through one incision. Then surgical instruments are inserted through the other incisions. In some cases, a minimally invasive surgery may need to be changed to a surgery that is done through a larger incision. This is called open surgery. Tell a health care provider about:  Any allergies you have.  All medicines you are taking, including vitamins, herbs, eye drops, creams, and over-the-counter medicines.  Any problems you or family members have had with anesthetic medicines.  Any blood disorders you have.  Any surgeries you have had.  Any medical conditions you have.  Whether you are pregnant or may be pregnant. What are the risks? Generally, this is a   safe procedure. However, problems may occur, including:  Infection.  Bleeding.  Allergic reactions to medicines.  Damage to nearby structures or organs.  A stone remaining in the common bile duct. The common bile duct carries bile from the gallbladder into the small intestine.  A bile leak from the cyst duct that is clipped when your gallbladder is removed. Medicines Ask your health care provider about:  Changing or stopping your regular medicines. This is especially important if you are taking diabetes medicines or blood thinners.  Taking medicines such as aspirin and ibuprofen. These medicines can thin your blood. Do not take these medicines unless your health care provider tells you to take them.  Taking over-the-counter medicines, vitamins, herbs, and supplements. General instructions  Let your health care provider know if you develop a cold or an infection  before surgery.  Plan to have someone take you home from the hospital or clinic.  If you will be going home right after the procedure, plan to have someone with you for 24 hours.  Ask your health care provider: ? How your surgery site will be marked. ? What steps will be taken to help prevent infection. These may include:  Removing hair at the surgery site.  Washing skin with a germ-killing soap.  Taking antibiotic medicine. What happens during the procedure?  An IV will be inserted into one of your veins.  You will be given one or both of the following: ? A medicine to help you relax (sedative). ? A medicine to make you fall asleep (general anesthetic).  A breathing tube will be placed in your mouth.  Your surgeon will make several small incisions in your abdomen.  The laparoscope will be inserted through one of the small incisions. The camera on the laparoscope will send images to a monitor in the operating room. This lets your surgeon see inside your abdomen.  A gas will be pumped into your abdomen. This will expand your abdomen to give the surgeon more room to perform the surgery.  Other tools that are needed for the procedure will be inserted through the other incisions. The gallbladder will be removed through one of the incisions.  Your common bile duct may be examined. If stones are found in the common bile duct, they may be removed.  After your gallbladder has been removed, the incisions will be closed with stitches (sutures), staples, or skin glue.  Your incisions may be covered with a bandage (dressing). The procedure may vary among health care providers and hospitals.   What happens after the procedure?  Your blood pressure, heart rate, breathing rate, and blood oxygen level will be monitored until you leave the hospital or clinic.  You will be given medicines as needed to control your pain.  If you were given a sedative during the procedure, it can affect you  for several hours. Do not drive or operate machinery until your health care provider says that it is safe. Summary  Minimally invasive cholecystectomy, also called laparoscopic cholecystectomy, is surgery to remove the gallbladder using small incisions.  Tell your health care provider about all the medical conditions you have and all the medicines you are taking for those conditions.  Before the procedure, follow instructions about eating or drinking restrictions and changing or stopping medicines.  If you were given a sedative during the procedure, it can affect you for several hours. Do not drive or operate machinery until your health care provider says that it is safe. This   information is not intended to replace advice given to you by your health care provider. Make sure you discuss any questions you have with your health care provider. Document Revised: 03/14/2019 Document Reviewed: 03/14/2019 Elsevier Patient Education  2021 Elsevier Inc.   

## 2020-08-20 NOTE — H&P (Signed)
[facility] History and Physical  Reason for Referral: Gallstones   Chief Complaint    Follow-up      Eileen Nunez is a 39 y.o. female.  HPI: Ms.  Nunez is following up after being seen for gallstones and complaints of lower right sided pain. We decided that we would get a CT of her abdomen and pelvis to ensure we were not missing anything given that her pain was more in the lower abdomen. Eileen Nunez says that Eileen Nunez has been avoiding all grease foods and her bagel season and that Eileen Nunez has not had another episode. Her CT demonstrated no evidence of a diverticulitis and her appendix was normal.  Eileen Nunez is here to discuss the option of cholecystectomy. Eileen Nunez has had some bloating and nausea on occasion.   Past Medical History:  Diagnosis Date  . Anxiety   . Diabetes mellitus without complication (HCC)    gestational diabetes  . Hypertension    during pregnancy  . Menorrhagia 09/07/2013   Has period about every 3 weeks and has cramp too    Past Surgical History:  Procedure Laterality Date  . CESAREAN SECTION     x 2  . DILITATION & CURRETTAGE/HYSTROSCOPY WITH THERMACHOICE ABLATION N/A 09/20/2013   Procedure: DILATATION & CURETTAGE/HYSTEROSCOPY WITH THERMACHOICE ABLATION;  Surgeon: John V Ferguson, MD;  Location: [facility];  Service: Gynecology;  Laterality: N/A;  total therapy time: 9 minutes 30 seconds;  temperature:  87 degrees   . TUBAL LIGATION      Family History  Problem Relation Age of Onset  . Cancer Father        liver  . Heart disease Maternal Grandmother   . Anuerysm Paternal Grandfather        stomach    Social History   Tobacco Use  . Smoking status: Former Smoker    Packs/day: 0.50    Years: 20.00    Pack years: 10.00    Types: Cigarettes    Start date: 09/21/1994    Quit date: 02/22/2016    Years since quitting: 4.4  . Smokeless tobacco: Never Used  Vaping Use  . Vaping Use: Never used  Substance Use Topics  . Alcohol use: Yes    Alcohol/week: 2.0 -  3.0 standard drinks    Types: 2 - 3 Standard drinks or equivalent per week    Comment: rare  . Drug use: No    Medications: I have reviewed the patient's current medications. Allergies as of 08/16/2020      Reactions   Celexa [citalopram Hydrobromide]    Bloating,decreased libido      Medication List       Accurate as of August 16, 2020  1:56 PM. If you have any questions, ask your nurse or doctor.        buPROPion 300 MG 24 hr tablet Commonly known as: WELLBUTRIN XL TAKE 1 TABLET(300 MG) BY MOUTH DAILY   levothyroxine 75 MCG tablet Commonly known as: SYNTHROID TAKE 1 TABLET(75 MCG) BY MOUTH DAILY   lisinopril 5 MG tablet Commonly known as: ZESTRIL TAKE 1 TABLET(5 MG) BY MOUTH DAILY   multivitamin tablet Take by mouth daily. gummies-2 daily   Qsymia 15-92 MG Cp24 Generic drug: Phentermine-Topiramate Take 1 tablet by mouth daily.   triamterene-hydrochlorothiazide 75-50 MG tablet Commonly known as: MAXZIDE TAKE 1 TABLET BY MOUTH DAILY        ROS:  A comprehensive review of systems was negative except for: Gastrointestinal: positive for abdominal pain   and nausea  Blood pressure 110/77, pulse (!) 101, temperature 98.2 F (36.8 C), temperature source Other (Comment), resp. rate 16, height 5\' 10"  (1.778 m), weight 211 lb (95.7 kg), last menstrual period 08/01/2020, SpO2 94 %. Physical Exam Vitals reviewed.  Constitutional:      Appearance: Normal appearance.  HENT:     Head: Normocephalic.     Nose: Nose normal.     Mouth/Throat:     Mouth: Mucous membranes are moist.  Eyes:     Extraocular Movements: Extraocular movements intact.  Cardiovascular:     Rate and Rhythm: Normal rate and regular rhythm.  Pulmonary:     Effort: Pulmonary effort is normal.     Breath sounds: Normal breath sounds.  Abdominal:     General: There is no distension.     Palpations: Abdomen is soft.     Tenderness: There is abdominal tenderness in the right upper quadrant.   Musculoskeletal:        General: Normal range of motion.     Cervical back: Normal range of motion.  Skin:    General: Skin is warm.  Neurological:     General: No focal deficit present.     Mental Status: Eileen Nunez is alert and oriented to person, place, and time.  Psychiatric:        Mood and Affect: Mood normal.        Behavior: Behavior normal.        Thought Content: Thought content normal.        Judgment: Judgment normal.     Results: CLINICAL DATA:  Right lower quadrant pain, lower abdominal pain  EXAM: CT ABDOMEN AND PELVIS WITH CONTRAST  TECHNIQUE: Multidetector CT imaging of the abdomen and pelvis was performed using the standard protocol following bolus administration of intravenous contrast.  CONTRAST:  09/29/2020 OMNIPAQUE IOHEXOL 300 MG/ML  SOLN  COMPARISON:  None.  FINDINGS: Lower chest: No acute abnormality.  Hepatobiliary: No focal liver abnormality. Gallbladder is contracted with a 1.5 cm stone present. No biliary dilatation.  Pancreas: Unremarkable.  Spleen: Unremarkable.  Adrenals/Urinary Tract: Adrenals are unremarkable. Too small to characterize low-attenuation renal lesions statistically reflecting cysts. No definite renal calculi. Bladder is unremarkable.  Stomach/Bowel: Stomach is within normal limits. Mild duodenal wall thickening likely due to underdistention. Bowel is normal in caliber. Appendix measures top normal at 7 mm. Oral contrast has not reached the cecum.  Vascular/Lymphatic: No significant vascular findings. No enlarged lymph nodes.  Reproductive: Uterus and bilateral adnexa are unremarkable.  Other: No ascites.  Abdominal wall is unremarkable.  Musculoskeletal: Lumbar spine degenerative changes. No acute osseous abnormality.  IMPRESSION: 1.5 cm gallstone.  Top-normal size appendix without secondary signs of acute appendicitis.   Electronically Signed   By: M.D.   On: 08/13/2020  16:41  CLINICAL DATA:  RIGHT abdominal pain for 1 month.  EXAM: ABDOMEN ULTRASOUND COMPLETE  COMPARISON:  None.  FINDINGS: Gallbladder: Gallbladder with wall-echo-shadow sign indicating a gallbladder filled with stones. No pericholecystic fluid. No sonographic Murphy's sign was elicited during the exam.  Common bile duct: Diameter: 5 mm  Liver: Liver is diffusely echogenic indicating fatty infiltration. No focal liver abnormality is identified. Portal vein is patent on color Doppler imaging with normal direction of blood flow towards the liver.  IVC: No abnormality visualized.  Pancreas: Visualized portion unremarkable.  Spleen: Size and appearance within normal limits.  Right Kidney: Length: 11.5 cm. Renal cortex echogenicity is within normal limits. Renal pyramids are at  least mildly echogenic throughout. No hydronephrosis.  Left Kidney: Length: 12.4 cm. Renal cortex echogenicity is within normal limits. Renal pyramids are at least mildly echogenic throughout. No hydronephrosis.  Abdominal aorta: No aneurysm visualized.  Other findings: None.  IMPRESSION: 1. Gallbladder appears to be filled with stones. No secondary signs of acute cholecystitis at this time. 2. No bile duct dilatation. 3. Fatty infiltration of the liver. 4. 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 Kidneys otherwise unremarkable. No hydronephrosis.   Electronically Signed   By: Stan  Maynard M.D.   On: 06/20/2020 13:16   Assessment & Plan:  Eileen Nunez is a 39 y.o. female with gallstones in the gallbladder that is also contracted. Eileen Nunez has a upper size of normal appendix without any inflammation. Eileen Nunez is worried about having another attack.  -PLAN: I counseled the patient about the indication, risks and benefits of laparoscopic cholecystectomy.  Eileen Nunez understands there is a very small chance for bleeding, infection,  injury to normal structures (including common bile duct), conversion to open surgery, persistent symptoms, evolution of postcholecystectomy diarrhea, need for secondary interventions, anesthesia reaction, cardiopulmonary issues and other risks not specifically detailed here. I described the expected recovery, the plan for follow-up and the restrictions during the recovery phase.  All questions were answered.  Discussed preop COVID testing.    All questions were answered to the satisfaction of the patient.    Liberty Stead C Jayshawn Colston 08/16/2020, 1:56 PM       

## 2020-08-24 ENCOUNTER — Ambulatory Visit (INDEPENDENT_AMBULATORY_CARE_PROVIDER_SITE_OTHER): Payer: 59 | Admitting: Nurse Practitioner

## 2020-08-24 ENCOUNTER — Other Ambulatory Visit: Payer: Self-pay

## 2020-08-24 VITALS — BP 130/78 | HR 88 | Temp 97.3°F | Ht 70.0 in | Wt 206.6 lb

## 2020-08-24 DIAGNOSIS — E039 Hypothyroidism, unspecified: Secondary | ICD-10-CM

## 2020-08-24 DIAGNOSIS — F418 Other specified anxiety disorders: Secondary | ICD-10-CM | POA: Diagnosis not present

## 2020-08-24 DIAGNOSIS — I10 Essential (primary) hypertension: Secondary | ICD-10-CM | POA: Diagnosis not present

## 2020-08-24 MED ORDER — QSYMIA 15-92 MG PO CP24: 1.0000 | ORAL_CAPSULE | Freq: Every day | ORAL | 2 refills | Status: DC

## 2020-08-24 NOTE — Progress Notes (Signed)
   Subjective:    Patient ID: Eileen Nunez, female    DOB: 08-Jul-1980, 40 y.o.   MRN: [id number]  Hypertension This is a chronic problem. The current episode started more than 1 year ago. Treatments tried: maxide and lisinopril. There are no compliance problems.       Review of Systems     Objective:   Physical Exam        Assessment & Plan:

## 2020-08-24 NOTE — Patient Instructions (Signed)
CRUCITA LACORTE  08/24/2020     @PREFPERIOPPHARMACY @   Your procedure is scheduled on  08/29/2020   Report to [facility] at  1020  A.M.   Call this number if you have problems the morning of surgery:  [phone]   Remember:  Do not eat or drink after midnight.                        Take these medicines the morning of surgery with A SIP OF WATER  Wellbutrin, levothyroxine.   Place clean sheets on your bed the night before your surgery. DO NOT sleep with pets this night.  Shower with CHG the night before and the morning of your procedure. DO NOT put CHG on your face, hair or genitals.  After each shower, dry off with a clean towel, put on clean, comfortable clothes and brush your teeth.      Do not wear jewelry, make-up or nail polish.  Do not wear lotions, powders, or perfumes, or deodorant.  Do not shave 48 hours prior to surgery.  Men may shave face and neck.  Do not bring valuables to the hospital.  [facility] is not responsible for any belongings or valuables.  Contacts, dentures or bridgework may not be worn into surgery.  Leave your suitcase in the car.  After surgery it may be brought to your room.  For patients admitted to the hospital, discharge time will be determined by your treatment team.  Patients discharged the day of surgery will not be allowed to drive home and must have someone with them for 24 hours.    Special instructions:  DO NOT smoke tobacco or vape the morning of your procedure.  Please read over the following fact sheets that you were given. Anesthesia Post-op Instructions and Care and Recovery After Surgery       Minimally Invasive Cholecystectomy Minimally invasive cholecystectomy is surgery to remove the gallbladder. The gallbladder is a pear-shaped organ that lies beneath the liver on the right side of the body. The gallbladder stores bile, which is a fluid that helps the body digest fats. Cholecystectomy is often done to  treat inflammation of the gallbladder (cholecystitis). This condition is usually caused by a buildup of gallstones (cholelithiasis) in the gallbladder. Gallstones can block the flow of bile, which can result in inflammation and pain. In severe cases, emergency surgery may be required. This procedure is done though small incisions in the abdomen, instead of one large incision. It is also called laparoscopic surgery. A thin scope with a camera (laparoscope) is inserted through one incision. Then surgical instruments are inserted through the other incisions. In some cases, a minimally invasive surgery may need to be changed to a surgery that is done through a larger incision. This is called open surgery. Tell a health care provider about:  Any allergies you have.  All medicines you are taking, including vitamins, herbs, eye drops, creams, and over-the-counter medicines.  Any problems you or family members have had with anesthetic medicines.  Any blood disorders you have.  Any surgeries you have had.  Any medical conditions you have.  Whether you are pregnant or may be pregnant. What are the risks? Generally, this is a safe procedure. However, problems may occur, including:  Infection.  Bleeding.  Allergic reactions to medicines.  Damage to nearby structures or organs.  A stone remaining in the common bile duct. The common bile  duct carries bile from the gallbladder into the small intestine.  A bile leak from the cyst duct that is clipped when your gallbladder is removed. What happens before the procedure? Staying hydrated Follow instructions from your health care provider about hydration, which may include:  Up to 2 hours before the procedure - you may continue to drink clear liquids, such as water, clear fruit juice, black coffee, and plain tea.   Eating and drinking restrictions Follow instructions from your health care provider about eating and drinking, which may include:  8  hours before the procedure - stop eating heavy meals or foods, such as meat, fried foods, or fatty foods.  6 hours before the procedure - stop eating light meals or foods, such as toast or cereal.  6 hours before the procedure - stop drinking milk or drinks that contain milk.  2 hours before the procedure - stop drinking clear liquids. Medicines Ask your health care provider about:  Changing or stopping your regular medicines. This is especially important if you are taking diabetes medicines or blood thinners.  Taking medicines such as aspirin and ibuprofen. These medicines can thin your blood. Do not take these medicines unless your health care provider tells you to take them.  Taking over-the-counter medicines, vitamins, herbs, and supplements. General instructions  Let your health care provider know if you develop a cold or an infection before surgery.  Plan to have someone take you home from the hospital or clinic.  If you will be going home right after the procedure, plan to have someone with you for 24 hours.  Ask your health care provider: ? How your surgery site will be marked. ? What steps will be taken to help prevent infection. These may include:  Removing hair at the surgery site.  Washing skin with a germ-killing soap.  Taking antibiotic medicine. What happens during the procedure?  An IV will be inserted into one of your veins.  You will be given one or both of the following: ? A medicine to help you relax (sedative). ? A medicine to make you fall asleep (general anesthetic).  A breathing tube will be placed in your mouth.  Your surgeon will make several small incisions in your abdomen.  The laparoscope will be inserted through one of the small incisions. The camera on the laparoscope will send images to a monitor in the operating room. This lets your surgeon see inside your abdomen.  A gas will be pumped into your abdomen. This will expand your abdomen to  give the surgeon more room to perform the surgery.  Other tools that are needed for the procedure will be inserted through the other incisions. The gallbladder will be removed through one of the incisions.  Your common bile duct may be examined. If stones are found in the common bile duct, they may be removed.  After your gallbladder has been removed, the incisions will be closed with stitches (sutures), staples, or skin glue.  Your incisions may be covered with a bandage (dressing). The procedure may vary among health care providers and hospitals.   What happens after the procedure?  Your blood pressure, heart rate, breathing rate, and blood oxygen level will be monitored until you leave the hospital or clinic.  You will be given medicines as needed to control your pain.  If you were given a sedative during the procedure, it can affect you for several hours. Do not drive or operate machinery until your health care provider  says that it is safe. Summary  Minimally invasive cholecystectomy, also called laparoscopic cholecystectomy, is surgery to remove the gallbladder using small incisions.  Tell your health care provider about all the medical conditions you have and all the medicines you are taking for those conditions.  Before the procedure, follow instructions about eating or drinking restrictions and changing or stopping medicines.  If you were given a sedative during the procedure, it can affect you for several hours. Do not drive or operate machinery until your health care provider says that it is safe. This information is not intended to replace advice given to you by your health care provider. Make sure you discuss any questions you have with your health care provider. Document Revised: 03/14/2019 Document Reviewed: 03/14/2019 Elsevier Patient Education  2021 [employer] General Anesthesia, Adult, Care After This sheet gives you information about how to care for yourself after  your procedure. Your health care provider may also give you more specific instructions. If you have problems or questions, contact your health care provider. What can I expect after the procedure? After the procedure, the following side effects are common:  Pain or discomfort at the IV site.  Nausea.  Vomiting.  Sore throat.  Trouble concentrating.  Feeling cold or chills.  Feeling weak or tired.  Sleepiness and fatigue.  Soreness and body aches. These side effects can affect parts of the body that were not involved in surgery. Follow these instructions at home: For the time period you were told by your health care provider:  Rest.  Do not participate in activities where you could fall or become injured.  Do not drive or use machinery.  Do not drink alcohol.  Do not take sleeping pills or medicines that cause drowsiness.  Do not make important decisions or sign legal documents.  Do not take care of children on your own.   Eating and drinking  Follow any instructions from your health care provider about eating or drinking restrictions.  When you feel hungry, start by eating small amounts of foods that are soft and easy to digest (bland), such as toast. Gradually return to your regular diet.  Drink enough fluid to keep your urine pale yellow.  If you vomit, rehydrate by drinking water, juice, or clear broth. General instructions  If you have sleep apnea, surgery and certain medicines can increase your risk for breathing problems. Follow instructions from your health care provider about wearing your sleep device: ? Anytime you are sleeping, including during daytime naps. ? While taking prescription pain medicines, sleeping medicines, or medicines that make you drowsy.  Have a responsible adult stay with you for the time you are told. It is important to have someone help care for you until you are awake and alert.  Return to your normal activities as told by your  health care provider. Ask your health care provider what activities are safe for you.  Take over-the-counter and prescription medicines only as told by your health care provider.  If you smoke, do not smoke without supervision.  Keep all follow-up visits as told by your health care provider. This is important. Contact a health care provider if:  You have nausea or vomiting that does not get better with medicine.  You cannot eat or drink without vomiting.  You have pain that does not get better with medicine.  You are unable to pass urine.  You develop a skin rash.  You have a fever.  You have redness around  your IV site that gets worse. Get help right away if:  You have difficulty breathing.  You have chest pain.  You have blood in your urine or stool, or you vomit blood. Summary  After the procedure, it is common to have a sore throat or nausea. It is also common to feel tired.  Have a responsible adult stay with you for the time you are told. It is important to have someone help care for you until you are awake and alert.  When you feel hungry, start by eating small amounts of foods that are soft and easy to digest (bland), such as toast. Gradually return to your regular diet.  Drink enough fluid to keep your urine pale yellow.  Return to your normal activities as told by your health care provider. Ask your health care provider what activities are safe for you. This information is not intended to replace advice given to you by your health care provider. Make sure you discuss any questions you have with your health care provider. Document Revised: 02/23/2020 Document Reviewed: 09/22/2019 Elsevier Patient Education  2021 [employer].

## 2020-08-24 NOTE — Progress Notes (Signed)
Subjective:    Patient ID: Eileen Nunez, female    DOB: 08/04/1980, 40 y.o.   MRN: [id number]  HPI   Pt is here for Qsymia refill and thyroid check up.   Pt is having no issues with Qsymia. States she has been on it long term and helps to maintain weight. Denies any medication side effects such as insomnia, depression, agitation, irritability.   No issues with thyroid medication. Denies medication side effects such as tremors, tachycardia, N/V, weight loss, heat intolerance, nervousness.   Pt mentioned feeling like something is stuck in her throat occasionally when she eats. Thinks it might be because of sinus drainage. No sore throat. No reflux symptoms. Scheduled for cholecystectomy next week.    Review of Systems  Constitutional: Negative for activity change, fatigue, fever and unexpected weight change.  HENT: Positive for postnasal drip and trouble swallowing. Negative for congestion, ear pain, rhinorrhea, sinus pressure, sinus pain, sneezing, sore throat and voice change.   Respiratory: Negative for cough, choking, chest tightness, shortness of breath and wheezing.   Cardiovascular: Negative for chest pain, palpitations and leg swelling.       Objective:   Physical Exam Vitals and nursing note reviewed.  Constitutional:      General: She is not in acute distress. HENT:     Right Ear: There is no impacted cerumen.     Left Ear: There is no impacted cerumen.     Ears:     Comments: Rt TM: mildly retracted; no erythema. Lt TM: partially obscured with light brown cerumen; TM normal color.     Nose: Nose normal. No congestion or rhinorrhea.     Mouth/Throat:     Mouth: Mucous membranes are moist.     Pharynx: No oropharyngeal exudate or posterior oropharyngeal erythema.  Eyes:     Conjunctiva/sclera: Conjunctivae normal.  Neck:     Comments: Thyroid non tender, no mass or goiter noted.  Cardiovascular:     Rate and Rhythm: Normal rate and regular rhythm.     Pulses:  Normal pulses.     Heart sounds: Normal heart sounds. No murmur heard.   Pulmonary:     Effort: Pulmonary effort is normal.     Breath sounds: Normal breath sounds.  Musculoskeletal:     Cervical back: Normal range of motion and neck supple.  Lymphadenopathy:     Cervical: No cervical adenopathy.  Neurological:     Mental Status: She is alert and oriented to person, place, and time.  Psychiatric:        Mood and Affect: Mood normal.        Behavior: Behavior normal.        Thought Content: Thought content normal.        Judgment: Judgment normal.     Today's Vitals   08/24/20 1058  BP: 130/78  Pulse: 88  Temp: (!) 97.3 F (36.3 C)  TempSrc: Oral  SpO2: 97%  Weight: 206 lb 9.6 oz (93.7 kg)  Height: 5\' 10"  (1.778 m)   Body mass index is 29.64 kg/m.       Assessment & Plan:   Problem List Items Addressed This Visit      Cardiovascular and Mediastinum   Hypertension     Endocrine   Hypothyroidism - Primary   Relevant Orders   TSH (Completed)     Other   Depression with anxiety   Morbid obesity (HCC)   Relevant Medications   Phentermine-Topiramate (QSYMIA) 15-92 MG  CP24       Meds ordered this encounter  Medications  . Phentermine-Topiramate (QSYMIA) 15-92 MG CP24    Sig: Take 1 tablet by mouth daily.    Dispense:  30 capsule    Refill:  2    Order Specific Question:   Supervising Provider    Answer:   Lilyan Punt A [[id number]]    Plan / Education   TSH pending.  -Ear is partially impacted with cerumen, peroxide 1:1 with  warm water was recommended to help remove impaction  - For allergies: Zaditor, Steroid nasal spray as directed  Continue Qsymia and other meds as directed. After her upcoming surgery, recommend resuming regular exercise and healthy diet.   -Follow up  in 3 months for medication check up or PRN

## 2020-08-25 ENCOUNTER — Encounter: Payer: Self-pay | Admitting: Nurse Practitioner

## 2020-08-25 LAB — TSH: TSH: 2.59 u[IU]/mL (ref 0.450–4.500)

## 2020-08-27 ENCOUNTER — Other Ambulatory Visit: Payer: Self-pay

## 2020-08-27 ENCOUNTER — Encounter (HOSPITAL_COMMUNITY)
Admission: RE | Admit: 2020-08-27 | Discharge: 2020-08-27 | Disposition: A | Discharge status: Home / Self Care | Payer: 59 | Source: Ambulatory Visit | Attending: General Surgery | Admitting: General Surgery

## 2020-08-27 ENCOUNTER — Other Ambulatory Visit (HOSPITAL_COMMUNITY)
Admission: RE | Admit: 2020-08-27 | Discharge: 2020-08-27 | Disposition: A | Discharge status: Home / Self Care | Payer: 59 | Source: Ambulatory Visit | Attending: General Surgery | Admitting: General Surgery

## 2020-08-27 ENCOUNTER — Encounter (HOSPITAL_COMMUNITY): Payer: Self-pay

## 2020-08-27 DIAGNOSIS — Z01812 Encounter for preprocedural laboratory examination: Secondary | ICD-10-CM | POA: Insufficient documentation

## 2020-08-27 DIAGNOSIS — Z20822 Contact with and (suspected) exposure to covid-19: Secondary | ICD-10-CM | POA: Insufficient documentation

## 2020-08-27 LAB — PREGNANCY, URINE: Preg Test, Ur: NEGATIVE

## 2020-08-28 LAB — SARS CORONAVIRUS 2 (TAT 6-24 HRS): SARS Coronavirus 2: NEGATIVE

## 2020-08-29 ENCOUNTER — Other Ambulatory Visit: Payer: Self-pay

## 2020-08-29 ENCOUNTER — Encounter (HOSPITAL_COMMUNITY)
Admission: RE | Disposition: A | Discharge status: Home / Self Care | Payer: Self-pay | Source: Ambulatory Visit | Attending: General Surgery

## 2020-08-29 ENCOUNTER — Ambulatory Visit (HOSPITAL_COMMUNITY): Payer: 59 | Admitting: Anesthesiology

## 2020-08-29 ENCOUNTER — Encounter (HOSPITAL_COMMUNITY): Payer: Self-pay | Admitting: General Surgery

## 2020-08-29 ENCOUNTER — Ambulatory Visit (HOSPITAL_COMMUNITY)
Admission: RE | Admit: 2020-08-29 | Discharge: 2020-08-29 | Disposition: A | Discharge status: Home / Self Care | Payer: 59 | Source: Ambulatory Visit | Attending: General Surgery | Admitting: General Surgery

## 2020-08-29 DIAGNOSIS — K8020 Calculus of gallbladder without cholecystitis without obstruction: Secondary | ICD-10-CM | POA: Diagnosis not present

## 2020-08-29 DIAGNOSIS — Z87891 Personal history of nicotine dependence: Secondary | ICD-10-CM | POA: Insufficient documentation

## 2020-08-29 DIAGNOSIS — K8010 Calculus of gallbladder with chronic cholecystitis without obstruction: Secondary | ICD-10-CM | POA: Diagnosis present

## 2020-08-29 DIAGNOSIS — Z8249 Family history of ischemic heart disease and other diseases of the circulatory system: Secondary | ICD-10-CM | POA: Diagnosis not present

## 2020-08-29 DIAGNOSIS — Z888 Allergy status to other drugs, medicaments and biological substances status: Secondary | ICD-10-CM | POA: Diagnosis not present

## 2020-08-29 DIAGNOSIS — Z800 Family history of malignant neoplasm of digestive organs: Secondary | ICD-10-CM | POA: Insufficient documentation

## 2020-08-29 DIAGNOSIS — E119 Type 2 diabetes mellitus without complications: Secondary | ICD-10-CM | POA: Diagnosis not present

## 2020-08-29 HISTORY — PX: CHOLECYSTECTOMY: SHX55

## 2020-08-29 LAB — GLUCOSE, CAPILLARY: Glucose-Capillary: 91 mg/dL (ref 70–99)

## 2020-08-29 SURGERY — LAPAROSCOPIC CHOLECYSTECTOMY
Anesthesia: General | Site: Abdomen

## 2020-08-29 MED ORDER — CHLORHEXIDINE GLUCONATE CLOTH 2 % EX PADS: 6.0000 | MEDICATED_PAD | Freq: Once | CUTANEOUS | Status: DC

## 2020-08-29 MED ORDER — FENTANYL CITRATE (PF) 100 MCG/2ML IJ SOLN
INTRAMUSCULAR | Status: DC | PRN
  Administered 2020-08-29 (×2): 100 ug via INTRAVENOUS

## 2020-08-29 MED ORDER — LIDOCAINE HCL (CARDIAC) PF 100 MG/5ML IV SOSY
PREFILLED_SYRINGE | INTRAVENOUS | Status: DC | PRN
  Administered 2020-08-29: 50 mg via INTRAVENOUS

## 2020-08-29 MED ORDER — KETOROLAC TROMETHAMINE 30 MG/ML IJ SOLN
30.0000 mg | Freq: Once | INTRAMUSCULAR | Status: AC
  Administered 2020-08-29: 30 mg via INTRAVENOUS

## 2020-08-29 MED ORDER — KETOROLAC TROMETHAMINE 30 MG/ML IJ SOLN
INTRAMUSCULAR | Status: AC
  Filled 2020-08-29: qty 1

## 2020-08-29 MED ORDER — HEMOSTATIC AGENTS (NO CHARGE) OPTIME
TOPICAL | Status: DC | PRN
  Administered 2020-08-29: 1 via TOPICAL

## 2020-08-29 MED ORDER — ONDANSETRON HCL 4 MG/2ML IJ SOLN
INTRAMUSCULAR | Status: AC
  Filled 2020-08-29: qty 2

## 2020-08-29 MED ORDER — DIPHENHYDRAMINE HCL 25 MG PO CAPS
ORAL_CAPSULE | ORAL | Status: AC
  Filled 2020-08-29: qty 1

## 2020-08-29 MED ORDER — MIDAZOLAM HCL 5 MG/5ML IJ SOLN
INTRAMUSCULAR | Status: DC | PRN
  Administered 2020-08-29: 2 mg via INTRAVENOUS

## 2020-08-29 MED ORDER — MIDAZOLAM HCL 2 MG/2ML IJ SOLN
INTRAMUSCULAR | Status: AC
  Filled 2020-08-29: qty 2

## 2020-08-29 MED ORDER — ORAL CARE MOUTH RINSE: 15.0000 mL | Freq: Once | OROMUCOSAL | Status: AC

## 2020-08-29 MED ORDER — SCOPOLAMINE 1 MG/3DAYS TD PT72
1.0000 | MEDICATED_PATCH | Freq: Once | TRANSDERMAL | Status: DC
  Administered 2020-08-29: 1.5 mg via TRANSDERMAL

## 2020-08-29 MED ORDER — SODIUM CHLORIDE 0.9 % IR SOLN
Status: DC | PRN
  Administered 2020-08-29: 1000 mL

## 2020-08-29 MED ORDER — ONDANSETRON HCL 4 MG/2ML IJ SOLN
INTRAMUSCULAR | Status: DC | PRN
  Administered 2020-08-29: 4 mg via INTRAVENOUS

## 2020-08-29 MED ORDER — MEPERIDINE HCL 50 MG/ML IJ SOLN: 6.2500 mg | INTRAMUSCULAR | Status: DC | PRN

## 2020-08-29 MED ORDER — PROPOFOL 10 MG/ML IV BOLUS
INTRAVENOUS | Status: DC | PRN
  Administered 2020-08-29: 200 mg via INTRAVENOUS

## 2020-08-29 MED ORDER — DEXAMETHASONE SODIUM PHOSPHATE 10 MG/ML IJ SOLN
INTRAMUSCULAR | Status: AC
  Filled 2020-08-29: qty 1

## 2020-08-29 MED ORDER — LACTATED RINGERS IV SOLN
INTRAVENOUS | Status: DC
  Administered 2020-08-29: 1000 mL via INTRAVENOUS

## 2020-08-29 MED ORDER — PROMETHAZINE HCL 25 MG/ML IJ SOLN: 6.2500 mg | INTRAMUSCULAR | Status: DC | PRN

## 2020-08-29 MED ORDER — SUGAMMADEX SODIUM 500 MG/5ML IV SOLN
INTRAVENOUS | Status: DC | PRN
  Administered 2020-08-29: 200 mg via INTRAVENOUS

## 2020-08-29 MED ORDER — DIPHENHYDRAMINE HCL 25 MG PO CAPS
25.0000 mg | ORAL_CAPSULE | Freq: Four times a day (QID) | ORAL | Status: DC | PRN
  Administered 2020-08-29: 25 mg via ORAL

## 2020-08-29 MED ORDER — BUPIVACAINE HCL (PF) 0.5 % IJ SOLN
INTRAMUSCULAR | Status: DC | PRN
  Administered 2020-08-29: 10 mL

## 2020-08-29 MED ORDER — SCOPOLAMINE 1 MG/3DAYS TD PT72
MEDICATED_PATCH | TRANSDERMAL | Status: AC
  Filled 2020-08-29: qty 1

## 2020-08-29 MED ORDER — SODIUM CHLORIDE 0.9 % IV SOLN
2.0000 g | INTRAVENOUS | Status: AC
  Administered 2020-08-29: 2 g via INTRAVENOUS
  Filled 2020-08-29: qty 2

## 2020-08-29 MED ORDER — CHLORHEXIDINE GLUCONATE 0.12 % MT SOLN
15.0000 mL | Freq: Once | OROMUCOSAL | Status: AC
  Administered 2020-08-29: 15 mL via OROMUCOSAL

## 2020-08-29 MED ORDER — FENTANYL CITRATE (PF) 100 MCG/2ML IJ SOLN
INTRAMUSCULAR | Status: AC
  Filled 2020-08-29: qty 4

## 2020-08-29 MED ORDER — OXYCODONE HCL 5 MG PO TABS: 5.0000 mg | ORAL_TABLET | ORAL | 0 refills | Status: DC | PRN

## 2020-08-29 MED ORDER — CEFOTETAN DISODIUM 2 G IJ SOLR
INTRAMUSCULAR | Status: AC
  Filled 2020-08-29: qty 2

## 2020-08-29 MED ORDER — HYDROMORPHONE HCL 1 MG/ML IJ SOLN
0.2500 mg | INTRAMUSCULAR | Status: DC | PRN
  Administered 2020-08-29 (×2): 0.5 mg via INTRAVENOUS
  Filled 2020-08-29 (×2): qty 0.5

## 2020-08-29 MED ORDER — DEXAMETHASONE SODIUM PHOSPHATE 10 MG/ML IJ SOLN
INTRAMUSCULAR | Status: DC | PRN
  Administered 2020-08-29: 10 mg via INTRAVENOUS

## 2020-08-29 MED ORDER — CHLORHEXIDINE GLUCONATE 0.12 % MT SOLN
OROMUCOSAL | Status: AC
  Filled 2020-08-29: qty 15

## 2020-08-29 MED ORDER — ONDANSETRON HCL 4 MG PO TABS: 4.0000 mg | ORAL_TABLET | Freq: Three times a day (TID) | ORAL | 1 refills | Status: DC | PRN

## 2020-08-29 MED ORDER — ROCURONIUM BROMIDE 100 MG/10ML IV SOLN
INTRAVENOUS | Status: DC | PRN
  Administered 2020-08-29: 50 mg via INTRAVENOUS

## 2020-08-29 MED ORDER — ROCURONIUM BROMIDE 10 MG/ML (PF) SYRINGE
PREFILLED_SYRINGE | INTRAVENOUS | Status: AC
  Filled 2020-08-29: qty 10

## 2020-08-29 MED ORDER — BUPIVACAINE HCL (PF) 0.5 % IJ SOLN
INTRAMUSCULAR | Status: AC
  Filled 2020-08-29: qty 30

## 2020-08-29 MED ORDER — PROPOFOL 10 MG/ML IV BOLUS
INTRAVENOUS | Status: AC
  Filled 2020-08-29: qty 20

## 2020-08-29 SURGICAL SUPPLY — 41 items
ADH SKN CLS APL DERMABOND .7 (GAUZE/BANDAGES/DRESSINGS) ×1
APL PRP STRL LF DISP 70% ISPRP (MISCELLANEOUS) ×1
APPLIER CLIP ROT 10 11.4 M/L (STAPLE) ×2
APR CLP MED LRG 11.4X10 (STAPLE) ×1
BAG RETRIEVAL 10 (BASKET) ×1
BLADE SURG 15 STRL LF DISP TIS (BLADE) ×1 IMPLANT
BLADE SURG 15 STRL SS (BLADE) ×2
CHLORAPREP W/TINT 26 (MISCELLANEOUS) ×2 IMPLANT
CLIP APPLIE ROT 10 11.4 M/L (STAPLE) ×1 IMPLANT
CLOTH BEACON ORANGE TIMEOUT ST (SAFETY) ×2 IMPLANT
COVER LIGHT HANDLE STERIS (MISCELLANEOUS) ×4 IMPLANT
COVER WAND RF STERILE (DRAPES) ×2 IMPLANT
DECANTER SPIKE VIAL GLASS SM (MISCELLANEOUS) ×2 IMPLANT
DERMABOND ADVANCED (GAUZE/BANDAGES/DRESSINGS) ×1
DERMABOND ADVANCED .7 DNX12 (GAUZE/BANDAGES/DRESSINGS) ×1 IMPLANT
ELECT REM PT RETURN 9FT ADLT (ELECTROSURGICAL) ×2
ELECTRODE REM PT RTRN 9FT ADLT (ELECTROSURGICAL) ×1 IMPLANT
GLOVE SURG ENC MOIS LTX SZ6.5 (GLOVE) ×2 IMPLANT
GLOVE SURG UNDER POLY LF SZ6.5 (GLOVE) ×4 IMPLANT
GLOVE SURG UNDER POLY LF SZ7 (GLOVE) ×4 IMPLANT
GOWN STRL REUS W/TWL LRG LVL3 (GOWN DISPOSABLE) ×8 IMPLANT
HEMOSTAT SNOW SURGICEL 2X4 (HEMOSTASIS) ×2 IMPLANT
INST SET LAPROSCOPIC AP (KITS) ×2 IMPLANT
KIT TURNOVER KIT A (KITS) ×2 IMPLANT
MANIFOLD NEPTUNE II (INSTRUMENTS) ×2 IMPLANT
NEEDLE INSUFFLATION 14GA 120MM (NEEDLE) ×2 IMPLANT
NS IRRIG 1000ML POUR BTL (IV SOLUTION) ×2 IMPLANT
PACK LAP CHOLE LZT030E (CUSTOM PROCEDURE TRAY) ×2 IMPLANT
PAD ARMBOARD 7.5X6 YLW CONV (MISCELLANEOUS) ×2 IMPLANT
SET BASIN LINEN APH (SET/KITS/TRAYS/PACK) ×2 IMPLANT
SET TUBE SMOKE EVAC HIGH FLOW (TUBING) ×2 IMPLANT
SLEEVE ENDOPATH XCEL 5M (ENDOMECHANICALS) ×2 IMPLANT
SUT MNCRL AB 4-0 PS2 18 (SUTURE) ×4 IMPLANT
SUT VICRYL 0 UR6 27IN ABS (SUTURE) ×2 IMPLANT
SYS BAG RETRIEVAL 10MM (BASKET) ×1
SYSTEM BAG RETRIEVAL 10MM (BASKET) ×1 IMPLANT
TROCAR ENDO BLADELESS 11MM (ENDOMECHANICALS) ×2 IMPLANT
TROCAR XCEL NON-BLD 5MMX100MML (ENDOMECHANICALS) ×2 IMPLANT
TROCAR XCEL UNIV SLVE 11M 100M (ENDOMECHANICALS) ×2 IMPLANT
TUBE CONNECTING 12X1/4 (SUCTIONS) ×2 IMPLANT
WARMER LAPAROSCOPE (MISCELLANEOUS) ×2 IMPLANT

## 2020-08-29 NOTE — Anesthesia Postprocedure Evaluation (Signed)
Anesthesia Post Note  Patient: Eileen Nunez  Procedure(s) Performed: LAPAROSCOPIC CHOLECYSTECTOMY (N/A Abdomen)  Patient location during evaluation: PACU Anesthesia Type: General Level of consciousness: awake, oriented and awake and alert Pain management: pain level controlled Vital Signs Assessment: post-procedure vital signs reviewed and stable Respiratory status: spontaneous breathing, respiratory function stable, nonlabored ventilation and patient connected to nasal cannula oxygen Cardiovascular status: stable Postop Assessment: no apparent nausea or vomiting Anesthetic complications: no   No complications documented.   Last Vitals:  Vitals:   08/29/20 0918  BP: 121/84  Pulse: 84  Resp: 15  Temp: 36.7 C  SpO2: 98%    Last Pain:  Vitals:   08/29/20 0918  TempSrc: Oral  PainSc: 0-No pain                 Terryon Pineiro

## 2020-08-29 NOTE — Transfer of Care (Signed)
Immediate Anesthesia Transfer of Care Note  Patient: Eileen Nunez  Procedure(s) Performed: LAPAROSCOPIC CHOLECYSTECTOMY (N/A Abdomen)  Patient Location: PACU  Anesthesia Type:General  Level of Consciousness: awake, alert , oriented and patient cooperative  Airway & Oxygen Therapy: Patient Spontanous Breathing and Patient connected to nasal cannula oxygen  Post-op Assessment: Report given to RN, Post -op Vital signs reviewed and stable and Patient moving all extremities X 4  Post vital signs: Reviewed and stable  Last Vitals:  Vitals Value Taken Time  BP 133/83 08/29/20 1049  Temp    Pulse 109 08/29/20 1051  Resp 15 08/29/20 1051  SpO2 99 % 08/29/20 1051  Vitals shown include unvalidated device data.  Last Pain:  Vitals:   08/29/20 0918  TempSrc: Oral  PainSc: 0-No pain      Patients Stated Pain Goal: 6 (08/29/20 8110)  Complications: No complications documented.

## 2020-08-29 NOTE — Op Note (Signed)
Operative Note   Preoperative Diagnosis: Symptomatic cholelithiasis, appendix upper limit of normal on CT    Postoperative Diagnosis: Symptomatic cholelithiasis, appendix upper limit of normal on CT, no inflammation    Procedure(s) Performed: Laparoscopic cholecystectomy   Surgeon: Leatrice Jewels. Henreitta Leber, MD   Assistants: Franky Macho, MD    Anesthesia: General endotracheal   Anesthesiologist: Molli Barrows, MD    Specimens: Gallbladder    Estimated Blood Loss: Minimal    Blood Replacement: None    Complications: None    Operative Findings: Contracted gallbladder with a large stone    Procedure: The patient was taken to the operating room and placed supine. General endotracheal anesthesia was induced. Intravenous antibiotics were administered per protocol. An orogastric tube positioned to decompress the stomach. The abdomen was prepared and draped in the usual sterile fashion.    A supraumbilical incision was made and a Veress technique was utilized to achieve pneumoperitoneum to 15 mmHg with carbon dioxide. A 11 mm optiview port was placed through the supraumbilical region, and a 10 mm 0-degree operative laparoscope was introduced. The area underlying the trocar and Veress needle were inspected and without evidence of injury.  Remaining trocars were placed under direct vision. Two 5 mm ports were placed in the right abdomen, between the anterior axillary and midclavicular line.  A final 11 mm port was placed through the mid-epigastrium, near the falciform ligament.    The gallbladder fundus was elevated cephalad and the infundibulum was retracted to the patient's right. The gallbladder/cystic duct junction was skeletonized. The cystic artery noted in the triangle of Calot and was also skeletonized.  We then continued liberal medial and lateral dissection until the critical view of safety was achieved.    The cystic duct was triply clipped and divided.  The cystic artery was doubly  clipped and divided. The gallbladder was then dissected from the liver bed with electrocautery. The specimen was placed in an Endopouch and was retrieved through the epigastric site.   Final inspection revealed acceptable hemostasis. Surgical SNOW was placed in the gallbladder bed. We then placed the patient in Trendelenburg and looked at the cecum. The appendix was normal appearing but no signs of inflammation or scarring.   Trocars were removed and pneumoperitoneum was released.  The epigastric and umbilical port sites were smaller than my finger tip. Skin incisions were closed with 4-0 Monocryl subcuticular sutures and Dermabond. The patient was awakened from anesthesia and extubated without complication.    Algis Greenhouse, MD [facility] [address] [place], [place] [address] [phone] (office)

## 2020-08-29 NOTE — Anesthesia Preprocedure Evaluation (Signed)
Anesthesia Evaluation  Patient identified by MRN, date of birth, ID band Patient awake    Reviewed: Allergy & Precautions, NPO status , Patient's Chart, lab work & pertinent test results  History of Anesthesia Complications Negative for: history of anesthetic complications  Airway Mallampati: I  TM Distance: >3 FB Neck ROM: Full    Dental  (+) Dental Advisory Given, Teeth Intact,    Pulmonary neg pulmonary ROS, former smoker,    Pulmonary exam normal breath sounds clear to auscultation       Cardiovascular Exercise Tolerance: Poor hypertension, Pt. on medications Normal cardiovascular exam Rhythm:Regular Rate:Normal     Neuro/Psych PSYCHIATRIC DISORDERS Anxiety Depression negative neurological ROS     GI/Hepatic negative GI ROS, Neg liver ROS,   Endo/Other  diabetes, Well Controlled, Type 2, Oral Hypoglycemic AgentsHypothyroidism   Renal/GU      Musculoskeletal negative musculoskeletal ROS (+)   Abdominal   Peds  Hematology negative hematology ROS (+)   Anesthesia Other Findings   Reproductive/Obstetrics negative OB ROS                           Anesthesia Physical Anesthesia Plan  ASA: III  Anesthesia Plan: General   Post-op Pain Management:    Induction: Intravenous  PONV Risk Score and Plan: 4 or greater and Ondansetron, Dexamethasone, Midazolam and Scopolamine patch - Pre-op  Airway Management Planned: Oral ETT  Additional Equipment:   Intra-op Plan:   Post-operative Plan:   Informed Consent: I have reviewed the patients History and Physical, chart, labs and discussed the procedure including the risks, benefits and alternatives for the proposed anesthesia with the patient or authorized representative who has indicated his/her understanding and acceptance.     Dental advisory given  Plan Discussed with: CRNA and Surgeon  Anesthesia Plan Comments:          Anesthesia Quick Evaluation

## 2020-08-29 NOTE — Progress Notes (Signed)
[facility]  Discussed with husband. Removed gallbladder. Appendix appeared normal.  Rx sent to [facility].   Will call in 2 weeks.  Algis Greenhouse, MD [facility] [address] [place], [place] [address] [phone] (office)

## 2020-08-29 NOTE — Anesthesia Procedure Notes (Signed)
Procedure Name: Intubation Date/Time: 08/29/2020 9:59 AM Performed by: Jonna Munro, CRNA Pre-anesthesia Checklist: Patient identified, Emergency Drugs available, Suction available, Patient being monitored and Timeout performed Patient Re-evaluated:Patient Re-evaluated prior to induction Oxygen Delivery Method: Circle system utilized Preoxygenation: Pre-oxygenation with 100% oxygen Induction Type: IV induction Ventilation: Mask ventilation without difficulty Laryngoscope Size: Mac and 3 Grade View: Grade I Tube type: Oral Tube size: 7.0 mm Number of attempts: 1 Airway Equipment and Method: Stylet Placement Confirmation: positive ETCO2,  ETT inserted through vocal cords under direct vision and breath sounds checked- equal and bilateral Secured at: 23 cm Tube secured with: Tape Dental Injury: Teeth and Oropharynx as per pre-operative assessment

## 2020-08-29 NOTE — Discharge Instructions (Signed)
Discharge Laparoscopic Surgery Instructions:  Common Complaints: Right shoulder pain is common after laparoscopic surgery. This is secondary to the gas used in the surgery being trapped under the diaphragm.  Walk to help your body absorb the gas. This will improve in a few days. Pain at the port sites are common, especially the larger port sites. This will improve with time.  Some nausea is common and poor appetite. The main goal is to stay hydrated the first few days after surgery.   Diet/ Activity: Diet as tolerated. You may not have an appetite, but it is important to stay hydrated. Drink 64 ounces of water a day. Your appetite will return with time.  Shower per your regular routine daily.  Do not take hot showers. Take warm showers that are less than 10 minutes. Rest and listen to your body, but do not remain in bed all day.  Walk everyday for at least 15-20 minutes. Deep cough and move around every 1-2 hours in the first few days after surgery.  Do not lift > 10 lbs, perform excessive bending, pushing, pulling, squatting for 1-2 weeks after surgery.  Do not pick at the dermabond glue on your incision sites.  This glue film will remain in place for 1-2 weeks and will start to peel off.  Do not place lotions or balms on your incision unless instructed to specifically by Dr. Bridges.   Pain Expectations and Narcotics: -After surgery you will have pain associated with your incisions and this is normal. The pain is muscular and nerve pain, and will get better with time. -You are encouraged and expected to take non narcotic medications like tylenol and ibuprofen (when able) to treat pain as multiple modalities can aid with pain treatment. -Narcotics are only used when pain is severe or there is breakthrough pain. -You are not expected to have a pain score of 0 after surgery, as we cannot prevent pain. A pain score of 3-4 that allows you to be functional, move, walk, and tolerate some activity is  the goal. The pain will continue to improve over the days after surgery and is dependent on your surgery. -Due to [place] law, we are only able to give a certain amount of pain medication to treat post operative pain, and we only give additional narcotics on a patient by patient basis.  -For most laparoscopic surgery, studies have shown that the majority of patients only need 10-15 narcotic pills, and for open surgeries most patients only need 15-20.   -Having appropriate expectations of pain and knowledge of pain management with non narcotics is important as we do not want anyone to become addicted to narcotic pain medication.  -Using ice packs in the first 48 hours and heating pads after 48 hours, wearing an abdominal binder (when recommended), and using over the counter medications are all ways to help with pain management.   -Simple acts like meditation and mindfulness practices after surgery can also help with pain control and research has proven the benefit of these practices.  Medication: Take tylenol and ibuprofen as needed for pain control, alternating every 4-6 hours.  Example:  Tylenol 1000mg @ 6am, 12noon, 6pm, 12midnight (Do not exceed 4000mg of tylenol a day). Ibuprofen 800mg @ 9am, 3pm, 9pm, 3am (Do not exceed 3600mg of ibuprofen a day).  Take Roxicodone for breakthrough pain every 4 hours.  Take Colace for constipation related to narcotic pain medication. If you do not have a bowel movement in 2 days, take Miralax   over the counter.  Drink plenty of water to also prevent constipation.   Contact Information: If you have questions or concerns, please call our office, [phone], Monday- Thursday 8AM-5PM and Friday 8AM-12Noon.  If it is after hours or on the weekend, please call [facility], [phone], and ask to speak to the surgeon on call for Dr. Henreitta Leber at [facility].    Minimally Invasive Cholecystectomy, Care After This sheet gives you information about how to care for  yourself after your procedure. Your doctor may also give you more specific instructions. If you have problems or questions, contact your doctor. What can I expect after the procedure? After the procedure, it is common:  To have pain at the areas of surgery. You will be given medicines for pain.  To vomit or feel like you may vomit.  To feel fullness in the belly (bloating) or to have pain in the shoulder. This comes from the gas that was used during the surgery. Follow these instructions at home: Medicines  Take over-the-counter and prescription medicines only as told by your doctor.  If you were prescribed an antibiotic medicine, take it as told by your doctor. Do not stop using the antibiotic even if you start to feel better.  Ask your doctor if the medicine prescribed to you: ? Requires you to avoid driving or using machinery. ? Can cause trouble pooping (constipation). You may need to take these actions to prevent or treat trouble pooping:  Drink enough fluid to keep your pee (urine) pale yellow.  Take over-the-counter or prescription medicines.  Eat foods that are high in fiber. These include beans, whole grains, and fresh fruits and vegetables.  Limit foods that are high in fat and sugar. These include fried or sweet foods. Incision care  Follow instructions from your doctor about how to take care of your cuts from surgery (incisions). Make sure you: ? Wash your hands with soap and water for at least 20 seconds before and after you change your bandage (dressing). If you cannot use soap and water, use hand sanitizer. ? Change your bandage as told by your doctor. ? Leave stitches (sutures), skin glue, or skin tape (adhesive) strips in place. They may need to stay in place for 2 weeks or longer. If tape strips get loose and curl up, you may trim the loose edges. Do not remove tape strips completely unless your doctor says it is okay.  Do not take baths, swim, or use a hot tub  until your doctor approves. You may shower.   Check your surgery area every day for signs of infection. Check for: ? More redness, swelling, or pain. ? Fluid or blood. ? Warmth. ? Pus or a bad smell.   Activity  Rest as told by your doctor.  Do not sit for a long time without moving. Get up to take short walks every 1-2 hours. This is important. Ask for help if you feel weak or unsteady.  Do not lift anything that is heavier than 10 lb (4.5 kg), or the limit that you are told, until your doctor says that it is safe.  Do not play contact sports until your doctor says it is okay.  Do not return to work or school until your doctor says it is okay.  Return to your normal activities as told by your doctor. Ask your doctor what activities are safe for you. General instructions  If you were given a medicine to help you relax (sedative)  during your procedure, it can affect you for many hours. Do not drive or use machinery until your doctor says that it is safe.  Keep all follow-up visits as told by your doctor. This is important. Contact a doctor if:  You get a rash.  You have more redness, swelling, or pain around your cuts from surgery.  You have fluid or blood coming from your cuts from surgery.  Your cuts from surgery feel warm to the touch.  You have pus or a bad smell coming from your cuts from surgery.  You have a fever.  One or more of your cuts from surgery breaks open. Get help right away if:  You have trouble breathing.  You have chest pain.  You have pain that is getting worse in your shoulders.  You faint or feel dizzy when you stand.  You have very bad pain in your belly (abdomen).  You feel like you may vomit or you vomit, and this lasts for more than one day.  You have leg pain. Summary  After your surgery, it is common to have pain at the areas of surgery. You may also have vomiting or fullness in the belly.  Follow your doctor's instructions about  medicine, activity restrictions, and caring for your surgery areas. Do not do activities that require a lot of effort.  Contact a doctor if you have a fever or other signs of infection, such as more redness, swelling, or pain around the cuts from surgery.  Get help right away if you have chest pain, increasing pain in the shoulders, or trouble breathing. This information is not intended to replace advice given to you by your health care provider. Make sure you discuss any questions you have with your health care provider. Document Revised: 05/24/2019 Document Reviewed: 05/24/2019 Elsevier Patient Education  2021 Elsevier Inc.     PLEASE REMOVE THE PATCH BEHIND YOUR RIGHT EAR ON Saturday September 01, 2020. WASH HANDS AFTER REMOVAL Scopolamine skin patches What is this medicine? SCOPOLAMINE (skoe POL a meen) is used to prevent nausea and vomiting caused by motion sickness, anesthesia and surgery. This medicine may be used for other purposes; ask your health care provider or pharmacist if you have questions. COMMON BRAND NAME(S): Transderm Scop What should I tell my health care provider before I take this medicine? They need to know if you have any of these conditions:  are scheduled to have a gastric secretion test  glaucoma  heart disease  kidney disease  liver disease  lung or breathing disease, like asthma  mental illness  prostate disease  seizures  stomach or intestine problems  trouble passing urine  an unusual or allergic reaction to scopolamine, atropine, other medicines, foods, dyes, or preservatives  pregnant or trying to get pregnant  breast-feeding How should I use this medicine? This medicine is for external use only. Follow the directions on the prescription label. Wear only 1 patch at a time. Choose an area behind the ear, that is clean, dry, hairless and free from any cuts or irritation. Wipe the area with a clean dry tissue. Peel off the plastic backing  of the skin patch, trying not to touch the adhesive side with your hands. Do not cut the patches. Firmly apply to the area you have chosen, with the metallic side of the patch to the skin and the tan-colored side showing. Once firmly in place, wash your hands well with soap and water. Do not get this medicine into your  eyes. After removing the patch, wash your hands and the area behind your ear thoroughly with soap and water. The patch will still contain some medicine after use. To avoid accidental contact or ingestion by children or pets, fold the used patch in half with the sticky side together and throw away in the trash out of the reach of children and pets. If you need to use a second patch after you remove the first, place it behind the other ear. A special MedGuide will be given to you by the pharmacist with each prescription and refill. Be sure to read this information carefully each time. Talk to your pediatrician regarding the use of this medicine in children. Special care may be needed. Overdosage: If you think you have taken too much of this medicine contact a poison control center or emergency room at once. NOTE: This medicine is only for you. Do not share this medicine with others. What if I miss a dose? This does not apply. This medicine is not for regular use. What may interact with this medicine?  alcohol  antihistamines for allergy cough and cold  atropine  certain medicines for anxiety or sleep  certain medicines for bladder problems like oxybutynin, tolterodine  certain medicines for depression like amitriptyline, fluoxetine, sertraline  certain medicines for stomach problems like dicyclomine, hyoscyamine  certain medicines for Parkinson's disease like benztropine, trihexyphenidyl  certain medicines for seizures like phenobarbital, primidone  general anesthetics like halothane, isoflurane, methoxyflurane, propofol  ipratropium  local anesthetics like lidocaine,  pramoxine, tetracaine  medicines that relax muscles for surgery  phenothiazines like chlorpromazine, mesoridazine, prochlorperazine, thioridazine  narcotic medicines for pain  other belladonna alkaloids This list may not describe all possible interactions. Give your health care provider a list of all the medicines, herbs, non-prescription drugs, or dietary supplements you use. Also tell them if you smoke, drink alcohol, or use illegal drugs. Some items may interact with your medicine. What should I watch for while using this medicine? Limit contact with water while swimming and bathing because the patch may fall off. If the patch falls off, throw it away and put a new one behind the other ear. You may get drowsy or dizzy. Do not drive, use machinery, or do anything that needs mental alertness until you know how this medicine affects you. Do not stand or sit up quickly, especially if you are an older patient. This reduces the risk of dizzy or fainting spells. Alcohol may interfere with the effect of this medicine. Avoid alcoholic drinks. Your mouth may get dry. Chewing sugarless gum or sucking hard candy, and drinking plenty of water may help. Contact your healthcare professional if the problem does not go away or is severe. This medicine may cause dry eyes and blurred vision. If you wear contact lenses, you may feel some discomfort. Lubricating drops may help. See your healthcare professional if the problem does not go away or is severe. If you are going to need surgery, an MRI, CT scan, or other procedure, tell your healthcare professional that you are using this medicine. You may need to remove the patch before the procedure. What side effects may I notice from receiving this medicine? Side effects that you should report to your doctor or health care professional as soon as possible:  allergic reactions like skin rash, itching or hives; swelling of the face, lips, or tongue  blurred  vision  changes in vision  confusion  dizziness  eye pain  fast, irregular heartbeat  hallucinations, loss of contact with reality  nausea, vomiting  pain or trouble passing urine  restlessness  seizures  skin irritation  stomach pain Side effects that usually do not require medical attention (report to your doctor or health care professional if they continue or are bothersome):  drowsiness  dry mouth  headache  sore throat This list may not describe all possible side effects. Call your doctor for medical advice about side effects. You may report side effects to FDA at 1-800-FDA-1088. Where should I keep my medicine? Keep out of the reach of children. Store at room temperature between 20 and 25 degrees C (68 and 77 degrees F). Keep this medicine in the foil package until ready to use. Throw away any unused medicine after the expiration date. NOTE: This sheet is a summary. It may not cover all possible information. If you have questions about this medicine, talk to your doctor, pharmacist, or health care provider.  2021 Elsevier/Gold Standard (2017-08-28 16:14:46)     General Anesthesia, Adult, Care After This sheet gives you information about how to care for yourself after your procedure. Your health care provider may also give you more specific instructions. If you have problems or questions, contact your health care provider. What can I expect after the procedure? After the procedure, the following side effects are common:  Pain or discomfort at the IV site.  Nausea.  Vomiting.  Sore throat.  Trouble concentrating.  Feeling cold or chills.  Feeling weak or tired.  Sleepiness and fatigue.  Soreness and body aches. These side effects can affect parts of the body that were not involved in surgery. Follow these instructions at home: For the time period you were told by your health care provider:  Rest.  Do not participate in activities where you  could fall or become injured.  Do not drive or use machinery.  Do not drink alcohol.  Do not take sleeping pills or medicines that cause drowsiness.  Do not make important decisions or sign legal documents.  Do not take care of children on your own.   Eating and drinking  Follow any instructions from your health care provider about eating or drinking restrictions.  When you feel hungry, start by eating small amounts of foods that are soft and easy to digest (bland), such as toast. Gradually return to your regular diet.  Drink enough fluid to keep your urine pale yellow.  If you vomit, rehydrate by drinking water, juice, or clear broth. General instructions  If you have sleep apnea, surgery and certain medicines can increase your risk for breathing problems. Follow instructions from your health care provider about wearing your sleep device: ? Anytime you are sleeping, including during daytime naps. ? While taking prescription pain medicines, sleeping medicines, or medicines that make you drowsy.  Have a responsible adult stay with you for the time you are told. It is important to have someone help care for you until you are awake and alert.  Return to your normal activities as told by your health care provider. Ask your health care provider what activities are safe for you.  Take over-the-counter and prescription medicines only as told by your health care provider.  If you smoke, do not smoke without supervision.  Keep all follow-up visits as told by your health care provider. This is important. Contact a health care provider if:  You have nausea or vomiting that does not get better with medicine.  You cannot eat or drink without vomiting.  You have pain that does not get better with medicine.  You are unable to pass urine.  You develop a skin rash.  You have a fever.  You have redness around your IV site that gets worse. Get help right away if:  You have difficulty  breathing.  You have chest pain.  You have blood in your urine or stool, or you vomit blood. Summary  After the procedure, it is common to have a sore throat or nausea. It is also common to feel tired.  Have a responsible adult stay with you for the time you are told. It is important to have someone help care for you until you are awake and alert.  When you feel hungry, start by eating small amounts of foods that are soft and easy to digest (bland), such as toast. Gradually return to your regular diet.  Drink enough fluid to keep your urine pale yellow.  Return to your normal activities as told by your health care provider. Ask your health care provider what activities are safe for you. This information is not intended to replace advice given to you by your health care provider. Make sure you discuss any questions you have with your health care provider. Document Revised: 02/23/2020 Document Reviewed: 09/22/2019 Elsevier Patient Education  2021 Elsevier Inc.    Oxycodone Capsules or Tablets What is this medicine? OXYCODONE (ox i KOE done) is a pain reliever, also called an opioid. It treats severe pain. This medicine may be used for other purposes; ask your health care provider or pharmacist if you have questions. COMMON BRAND NAME(S): Dazidox, Endocodone, Oxaydo, OXECTA, OxyIR, Percolone, Roxicodone, Roxybond What should I tell my health care provider before I take this medicine? They need to know if you have any of these conditions:  brain tumor  drug abuse or addiction  head injury  heart disease  if you often drink alcohol  kidney disease  liver disease  low adrenal gland function  lung disease, asthma, or breathing problem  seizures  stomach or intestine problems  taken an MAOI such as Marplan, Nardil, or Parnate in the last 14 days  an unusual or allergic reaction to oxycodone, other drugs, foods, dyes, or preservatives  pregnant or trying to get  pregnant  breast-feeding How should I use this medicine? Take this medicine by mouth with water. Take it as directed on the prescription label at the same time every day. You can take it with or without food. If it upsets your stomach, take it with food. Keep taking it unless your health care provider tells you to stop. Some brands of this medicine, like Oxaydo, have special instructions. Ask your doctor or pharmacist if these directions are for you: Do not cut, crush or chew this medicine. Do not wet, soak, or lick the tablet before you take it. A special MedGuide will be given to you by the pharmacist with each prescription and refill. Be sure to read this information carefully each time. Talk to your pediatrician regarding the use of this medicine in children. Special care may be needed. Overdosage: If you think you have taken too much of this medicine contact a poison control center or emergency room at once. NOTE: This medicine is only for you. Do not share this medicine with others. What if I miss a dose? If you miss a dose, take it as soon as you can. If it is almost time for your next dose, take only that dose. Do not take  double or extra doses. What may interact with this medicine? Do not take this medicine with any of the following medications:  safinamide This medicine may interact with the following medications:  alcohol  antihistamines for allergy, cough, and cold  atropine  certain antivirals for HIV or hepatitis  certain antibiotics like clarithromycin, erythromycin, linezolid, rifampin  certain medicines for anxiety or sleep  certain medicines for bladder problems like oxybutynin, tolterodine  certain medicines for depression like amitriptyline, fluoxetine, sertraline  certain medicines for fungal infections like ketoconazole, itraconazole, posaconazole  certain medicines for migraine headache like almotriptan, eletriptan, frovatriptan, naratriptan, rizatriptan,  sumatriptan, zolmitriptan  certain medicines for nausea or vomiting like dolasetron, granisetron, ondansetron, palonosetron  certain medicines for Parkinson's disease like benztropine, trihexyphenidyl  certain medicines for seizures like carbamazepine, phenobarbital, phenytoin, primidone  certain medicines for stomach problems like dicyclomine, hyoscyamine  certain medicines for travel sickness like scopolamine  diuretics  general anesthetics like halothane, isoflurane, methoxyflurane, propofol  ipratropium  MAOIs like Marplan, Nardil, and Parnate  medicines that relax muscles  methylene blue  other narcotic medicines for pain or cough  phenothiazines like chlorpromazine, mesoridazine, prochlorperazine, thioridazine This list may not describe all possible interactions. Give your health care provider a list of all the medicines, herbs, non-prescription drugs, or dietary supplements you use. Also tell them if you smoke, drink alcohol, or use illegal drugs. Some items may interact with your medicine. What should I watch for while using this medicine? Tell your health care provider if your pain does not go away, if it gets worse, or if you have new or a different type of pain. You may develop tolerance to this medicine. Tolerance means that you will need a higher dose of the medicine for pain relief. Tolerance is normal and is expected if you take this medicine for a long time. Do not suddenly stop taking your medicine because you may develop a severe reaction. Your body becomes used to the medicine. This does NOT mean you are addicted. Addiction is a behavior related to getting and using a medicine for a nonmedical reason. If you have pain, you have a medical reason to take pain medicine. Your health care provider will tell you how much medicine to take. If your health care provider wants you to stop the medicine, the dose will be slowly lowered over time to avoid any side effects. If you  take other medicines that also cause drowsiness such as other narcotic pain medicines, benzodiazepines, or other medicines for sleep, you may have more side effects. Give your health care provider a list of all medicines you use. He or she will tell you how much medicine to take. Do not take more medicine than directed. Get emergency help right away if you have trouble breathing or are unusually tired or sleepy. Talk to your health care provider about naloxone and how to get it. Naloxone is an emergency medicine used for an opioid overdose. An overdose can happen if you take too much opioid. It can also happen if an opioid is taken with some other medicines or substances, such as alcohol. Know the symptoms of an overdose, such as trouble breathing, unusually tired or sleepy, or not being able to respond or wake up. Make sure to tell caregivers and close contacts where it is stored. Make sure they know how to use it. After naloxone is given, you must get emergency help right away. Naloxone is a temporary treatment. Repeat doses may be needed. You may get  drowsy or dizzy. Do not drive, use machinery, or do anything that needs mental alertness until you know how this medicine affects you. Do not stand up or sit up quickly, especially if you are an older patient. This reduces the risk of dizzy or fainting spells. Alcohol may interfere with the effect of this medicine. Avoid alcoholic drinks. This medicine will cause constipation. If you do not have a bowel movement for 3 days, call your health care provider. Your mouth may get dry. Chewing sugarless gum or sucking hard candy and drinking plenty of water may help. Contact your health care provider if the problem does not go away or is severe. What side effects may I notice from receiving this medicine? Side effects that you should report to your doctor or health care professional as soon as possible:  allergic reactions (skin rash, itching or hives; swelling of  the face, lips, or tongue)  confusion  kidney injury (trouble passing urine or change in the amount of urine)  low adrenal gland function (nausea; vomiting; loss of appetite; unusually weak or tired; dizziness; low blood pressure)  low blood pressure (dizziness; feeling faint or lightheaded, falls; unusually weak or tired)  serotonin syndrome (irritable; confusion; diarrhea; fast or irregular heartbeat; muscle twitching; stiff muscles; trouble walking; sweating; high fever; seizures; chills; vomiting)  trouble breathing Side effects that usually do not require medical attention (report to your doctor or health care professional if they continue or are bothersome):  constipation  dry mouth  nausea, vomiting  tiredness This list may not describe all possible side effects. Call your doctor for medical advice about side effects. You may report side effects to FDA at 1-800-FDA-1088. Where should I keep my medicine? Keep out of the reach of children and pets. This medicine can be abused. Keep it in a safe place to protect it from theft. Do not share it with anyone. It is only for you. Selling or giving away this medicine is dangerous and against the law. Store at [employer] C (77 degrees F). Protect from light and moisture. Keep the container tightly closed. Get rid of any unused medicine after the expiration date. This medicine may cause harm and death if it is taken by other adults, children, or pets. It is important to get rid of the medicine as soon as you no longer need it or it is expired. You can do this in two ways:  Take the medicine to a medicine take-back program. Check with your pharmacy or law enforcement to find a location.  If you cannot return the medicine, flush it down the toilet. NOTE: This sheet is a summary. It may not cover all possible information. If you have questions about this medicine, talk to your doctor, pharmacist, or health care provider.  2021 Elsevier/Gold  Standard (2020-04-30 13:26:06)  Ondansetron oral dissolving tablet What is this medicine? ONDANSETRON (on DAN se tron) is used to treat nausea and vomiting caused by chemotherapy. It is also used to prevent or treat nausea and vomiting after surgery. This medicine may be used for other purposes; ask your health care provider or pharmacist if you have questions. COMMON BRAND NAME(S): Zofran ODT What should I tell my health care provider before I take this medicine? They need to know if you have any of these conditions:  heart disease  history of irregular heartbeat  liver disease  low levels of magnesium or potassium in the blood  an unusual or allergic reaction to ondansetron, granisetron, other  medicines, foods, dyes, or preservatives  pregnant or trying to get pregnant  breast-feeding How should I use this medicine? These tablets are made to dissolve in the mouth. Do not try to push the tablet through the foil backing. With dry hands, peel away the foil backing and gently remove the tablet. Place the tablet in the mouth and allow it to dissolve, then swallow. While you may take these tablets with water, it is not necessary to do so. Talk to your pediatrician regarding the use of this medicine in children. Special care may be needed. Overdosage: If you think you have taken too much of this medicine contact a poison control center or emergency room at once. NOTE: This medicine is only for you. Do not share this medicine with others. What if I miss a dose? If you miss a dose, take it as soon as you can. If it is almost time for your next dose, take only that dose. Do not take double or extra doses. What may interact with this medicine? Do not take this medicine with any of the following medications:  apomorphine  certain medicines for fungal infections like fluconazole, itraconazole, ketoconazole, posaconazole,  voriconazole  cisapride  dronedarone  pimozide  thioridazine This medicine may also interact with the following medications:  carbamazepine  certain medicines for depression, anxiety, or psychotic disturbances  fentanyl  linezolid  MAOIs like Carbex, Eldepryl, Marplan, Nardil, and Parnate  methylene blue (injected into a vein)  other medicines that prolong the QT interval (cause an abnormal heart rhythm) like dofetilide, ziprasidone  phenytoin  rifampicin  tramadol This list may not describe all possible interactions. Give your health care provider a list of all the medicines, herbs, non-prescription drugs, or dietary supplements you use. Also tell them if you smoke, drink alcohol, or use illegal drugs. Some items may interact with your medicine. What should I watch for while using this medicine? Check with your doctor or health care professional as soon as you can if you have any sign of an allergic reaction. What side effects may I notice from receiving this medicine? Side effects that you should report to your doctor or health care professional as soon as possible:  allergic reactions like skin rash, itching or hives, swelling of the face, lips, or tongue  breathing problems  confusion  dizziness  fast or irregular heartbeat  feeling faint or lightheaded, falls  fever and chills  loss of balance or coordination  seizures  sweating  swelling of the hands and feet  tightness in the chest  tremors  unusually weak or tired Side effects that usually do not require medical attention (report to your doctor or health care professional if they continue or are bothersome):  constipation or diarrhea  headache This list may not describe all possible side effects. Call your doctor for medical advice about side effects. You may report side effects to FDA at 1-800-FDA-1088. Where should I keep my medicine? Keep out of the reach of children. Store between 2  and 30 degrees C (36 and 86 degrees F). Throw away any unused medicine after the expiration date. NOTE: This sheet is a summary. It may not cover all possible information. If you have questions about this medicine, talk to your doctor, pharmacist, or health care provider.  2021 Elsevier/Gold Standard (2018-06-01 07:14:10)     YOU HAD  OF BENADRYL FOR ITCHING AFTER NARCOTIC PAIN MEDICATION, THIS WILL CONTRIBUTE TO DROWINESS

## 2020-08-29 NOTE — Progress Notes (Signed)
Pt started to itch all over after having 2nd dose of Dilaudid. No rash noted. Given 25 mg Benadryl per Dr. Einar Crow order.

## 2020-08-29 NOTE — Interval H&P Note (Signed)
History and Physical Interval Note:  08/29/2020 9:37 AM  Eileen Nunez  has presented today for surgery, with the diagnosis of Cholelithiasis.  The various methods of treatment have been discussed with the patient and family. After consideration of risks, benefits and other options for treatment, the patient has consented to  Procedure(s) with comments: LAPAROSCOPIC CHOLECYSTECTOMY (N/A) - pt knows to arrive at 8:30 as a surgical intervention.  The patient's history has been reviewed, patient examined, no change in status, stable for surgery.  I have reviewed the patient's chart and labs.  Questions were answered to the patient's satisfaction.    No changes. Will look at the appendix to ensure nothing abnormal. Patient in agreement with removing appendix if abnormal given CT with upper limit of normal size appendix.  [name]

## 2020-08-30 ENCOUNTER — Encounter (HOSPITAL_COMMUNITY): Payer: Self-pay | Admitting: General Surgery

## 2020-08-30 LAB — SURGICAL PATHOLOGY

## 2020-09-13 ENCOUNTER — Ambulatory Visit (INDEPENDENT_AMBULATORY_CARE_PROVIDER_SITE_OTHER): Payer: Self-pay | Admitting: General Surgery

## 2020-09-13 DIAGNOSIS — K8020 Calculus of gallbladder without cholecystitis without obstruction: Secondary | ICD-10-CM

## 2020-09-14 NOTE — Progress Notes (Signed)
[facility]  [facility]  I am calling the patient for post operative evaluation. This is not a billable encounter as it is under the global charges for the surgery.  The patient had a laparoscopic cholecystectomy on 08/29/2020. The patient reports that she is doing well. The are tolerating a diet, having good pain control, and having regular Bms.  The incisions are healing well. The patient has no concerns.   Pathology: FINAL MICROSCOPIC DIAGNOSIS:   A. GALLBLADDER, CHOLECYSTECTOMY:  - Chronic cholecystitis with cholelithiasis.   Will see the patient PRN.   Algis Greenhouse, MD [facility] [address] [place], [place] [address] [phone] (office)

## 2020-09-21 ENCOUNTER — Other Ambulatory Visit: Payer: Self-pay | Admitting: Nurse Practitioner

## 2020-10-10 ENCOUNTER — Other Ambulatory Visit: Payer: Self-pay | Admitting: Nurse Practitioner

## 2020-10-26 ENCOUNTER — Telehealth: Payer: Self-pay | Admitting: Family Medicine

## 2020-10-29 NOTE — Telephone Encounter (Signed)
No results found for: HGBA1C  Lab Results  Component Value Date   CREATININE 1.10 (H) 08/13/2020     Lab Results  Component Value Date   CHOL 177 01/19/2020   HDL 57 01/19/2020   LDLCALC 91 01/19/2020   TRIG 173 (H) 01/19/2020   CHOLHDL 3.1 01/19/2020     BP Readings from Last 3 Encounters:  08/29/20 (!) 135/91  08/27/20 132/80  08/24/20 130/78

## 2020-10-29 NOTE — Telephone Encounter (Signed)
Please contact patient to have her set up appt in early June with Eber Jones. Thank you!

## 2020-10-29 NOTE — Telephone Encounter (Signed)
Sent mychart message

## 2020-10-29 NOTE — Telephone Encounter (Signed)
Needs appt in early June per last note with carolyn to f/u on bp and pt on weight loss medication qsimia.  Dr. Ladona Ridgel

## 2020-11-03 ENCOUNTER — Other Ambulatory Visit: Payer: Self-pay | Admitting: Nurse Practitioner

## 2020-11-29 ENCOUNTER — Other Ambulatory Visit: Payer: Self-pay | Admitting: Nurse Practitioner

## 2020-11-30 NOTE — Telephone Encounter (Signed)
Patient scheduled an appt for June 30th  needs a refill on   triamterene-hydrochlorothiazide (MAXZIDE) 75-50 MG tablet  and  QSYMIA 15-92 MG CP24      [facility] [address]

## 2020-12-01 ENCOUNTER — Other Ambulatory Visit: Payer: Self-pay | Admitting: Family Medicine

## 2020-12-20 ENCOUNTER — Other Ambulatory Visit: Payer: Self-pay

## 2020-12-20 ENCOUNTER — Ambulatory Visit (INDEPENDENT_AMBULATORY_CARE_PROVIDER_SITE_OTHER): Payer: 59 | Admitting: Nurse Practitioner

## 2020-12-20 VITALS — BP 122/82 | Temp 97.9°F | Ht 70.0 in | Wt 206.6 lb

## 2020-12-20 DIAGNOSIS — F418 Other specified anxiety disorders: Secondary | ICD-10-CM

## 2020-12-20 DIAGNOSIS — E039 Hypothyroidism, unspecified: Secondary | ICD-10-CM

## 2020-12-20 DIAGNOSIS — R7989 Other specified abnormal findings of blood chemistry: Secondary | ICD-10-CM

## 2020-12-20 DIAGNOSIS — I10 Essential (primary) hypertension: Secondary | ICD-10-CM

## 2020-12-20 DIAGNOSIS — Z13220 Encounter for screening for lipoid disorders: Secondary | ICD-10-CM

## 2020-12-20 DIAGNOSIS — Z79899 Other long term (current) drug therapy: Secondary | ICD-10-CM

## 2020-12-20 MED ORDER — TRIAMTERENE-HCTZ 75-50 MG PO TABS: 1.0000 | ORAL_TABLET | Freq: Every day | ORAL | 1 refills | Status: DC

## 2020-12-20 MED ORDER — LISINOPRIL 5 MG PO TABS: 5.0000 mg | ORAL_TABLET | Freq: Every day | ORAL | 1 refills | Status: DC

## 2020-12-20 MED ORDER — QSYMIA 15-92 MG PO CP24: 1.0000 | ORAL_CAPSULE | Freq: Every day | ORAL | 2 refills | Status: DC

## 2020-12-20 NOTE — Progress Notes (Signed)
Subjective:    Patient ID: Eileen Nunez, female    DOB: May 27, 1981, 40 y.o.   MRN: [id number]  Hypertension This is a chronic problem. The current episode started more than 1 year ago. Pertinent negatives include no chest pain or shortness of breath. Treatments tried: lisinopril. There are no compliance problems.    Would like a script for clobetasol cream for psoriasis; has chronic off/on issues on a few areas; using some she has at home; uses sparingly only when needed . Doing well with her activity.  Trying to eat a healthy diet.  Had lost down to 200 pounds on Qsymia but struggling recently due to vacation.  Denies any adverse effects.  Review of Systems  HENT:  Negative for sore throat and trouble swallowing.   Respiratory:  Negative for chest tightness and shortness of breath.   Cardiovascular:  Negative for chest pain and leg swelling.       Objective:   Physical Exam NAD.  Alert, oriented.  Thyroid nontender to palpation, no mass or goiter noted.  Lungs clear.  Heart regular rate rhythm.  Lower extremities no edema. Last TSH on 08/24/2020 was 2.6. Today's Vitals   12/20/20 1135  BP: 122/82  Temp: 97.9 F (36.6 C)  TempSrc: Oral  Weight: 206 lb 9.6 oz (93.7 kg)  Height: 5\' 10"  (1.778 m)   Body mass index is 29.64 kg/m.        Assessment & Plan:   Problem List Items Addressed This Visit       Cardiovascular and Mediastinum   Hypertension - Primary   Relevant Medications   triamterene-hydrochlorothiazide (MAXZIDE) 75-50 MG tablet   lisinopril (ZESTRIL) 5 MG tablet   Other Relevant Orders   Comprehensive metabolic panel     Endocrine   Hypothyroidism     Other   Depression with anxiety   Morbid obesity (HCC)   Relevant Medications   Phentermine-Topiramate (QSYMIA) 15-92 MG CP24   Other Visit Diagnoses     High risk medication use       Relevant Orders   Comprehensive metabolic panel   Screening for lipid disorders       Relevant Orders   Lipid  panel   Abnormal CBC       Relevant Orders   CBC with Differential/Platelet         Meds ordered this encounter  Medications   triamterene-hydrochlorothiazide (MAXZIDE) 75-50 MG tablet    Sig: Take 1 tablet by mouth daily.    Dispense:  90 tablet    Refill:  1    Order Specific Question:   Supervising Provider    Answer:   A [[id number]]   Phentermine-Topiramate (QSYMIA) 15-92 MG CP24    Sig: Take 1 capsule by mouth daily.    Dispense:  30 capsule    Refill:  2    Order Specific Question:   Supervising Provider    Answer:   Lilyan Punt A [[id number]]   lisinopril (ZESTRIL) 5 MG tablet    Sig: Take 1 tablet (5 mg total) by mouth daily.    Dispense:  90 tablet    Refill:  1    Order Specific Question:   Supervising Provider    Answer:   Lilyan Punt A [[id number]]   clobetasol cream (TEMOVATE) 0.05 %    Sig: Apply 1 application topically 2 (two) times daily. PRN up to 2 weeks at a time    Dispense:  30 g  Refill:  0    Order Specific Question:   Supervising Provider    Answer:   Lilyan Punt A [[id number]]   Continue current medications as directed. Routine labs pending. Recommend patient restart healthy lifestyle changes including healthy diet. Gets routine preventive health physicals with gynecology. Recommend Tdap at local pharmacy. Recheck here in 3 months if she wishes to consider continuing Qsymia.

## 2020-12-20 NOTE — Patient Instructions (Signed)
[place] 

## 2020-12-22 ENCOUNTER — Encounter: Payer: Self-pay | Admitting: Nurse Practitioner

## 2020-12-22 MED ORDER — CLOBETASOL PROPIONATE 0.05 % EX CREA: 1.0000 "application " | TOPICAL_CREAM | Freq: Two times a day (BID) | CUTANEOUS | 0 refills | Status: DC

## 2020-12-31 ENCOUNTER — Encounter: Payer: Self-pay | Admitting: Nurse Practitioner

## 2021-01-04 ENCOUNTER — Other Ambulatory Visit: Payer: Self-pay | Admitting: Nurse Practitioner

## 2021-01-04 MED ORDER — CLOBETASOL PROPIONATE 0.05 % EX SOLN: 1.0000 "application " | Freq: Two times a day (BID) | CUTANEOUS | 2 refills | Status: DC

## 2021-01-10 ENCOUNTER — Encounter: Payer: Self-pay | Admitting: Nurse Practitioner

## 2021-01-12 ENCOUNTER — Other Ambulatory Visit: Payer: Self-pay | Admitting: Nurse Practitioner

## 2021-01-12 DIAGNOSIS — I10 Essential (primary) hypertension: Secondary | ICD-10-CM

## 2021-01-17 ENCOUNTER — Other Ambulatory Visit: Payer: Self-pay | Admitting: Nurse Practitioner

## 2021-01-17 DIAGNOSIS — G5691 Unspecified mononeuropathy of right upper limb: Secondary | ICD-10-CM

## 2021-01-19 ENCOUNTER — Other Ambulatory Visit: Payer: Self-pay | Admitting: Nurse Practitioner

## 2021-01-24 ENCOUNTER — Encounter: Payer: Self-pay | Admitting: Family Medicine

## 2021-01-24 ENCOUNTER — Other Ambulatory Visit: Payer: Self-pay

## 2021-01-24 ENCOUNTER — Ambulatory Visit (INDEPENDENT_AMBULATORY_CARE_PROVIDER_SITE_OTHER): Payer: 59 | Admitting: Family Medicine

## 2021-01-24 DIAGNOSIS — R200 Anesthesia of skin: Secondary | ICD-10-CM | POA: Diagnosis not present

## 2021-01-24 MED ORDER — PREDNISONE 10 MG PO TABS: ORAL_TABLET | ORAL | 0 refills | Status: DC

## 2021-01-24 NOTE — Progress Notes (Signed)
Office Visit Note   Patient: Eileen Nunez           Date of Birth: 05-Oct-1980           MRN: [id number] Visit Date: 01/24/2021 Requested by: Campbell Riches, NP [address] [place],  [place] [address] PCP: Annalee Genta, DO  Subjective: Chief Complaint  Patient presents with   Right Wrist - Numbness, Pain    Numbness in the hand and fingers x years (possibly 10). Progressively worsening. Starting to having some pain in the wrist with certain movements. Dropping objects. Right-hand dominant. Used to work in [employer] - much repetitive motion with the hands/wrists. Has started wearing a wrist splint at night - does help.    HPI: Here with right hand numbness.  Symptoms started 10 years ago, no injury.  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  She is right hand dominant.  Symptoms have been tolerable, but in the past year it has gotten worse to the point that she has started dropping things.  Denies any neck pain.  Recently she started wearing a brace at night and that seems to have helped.  She is otherwise been in good health.                ROS: Denies any history of diabetes.  All other systems were reviewed and are negative.  Objective: Vital Signs: There were no vitals taken for this visit.  Physical Exam:  General:  Alert and oriented, in no acute distress. Pulm:  Breathing unlabored. Psy:  Normal mood, congruent affect. Skin: No erythema or rash. Right arm: No wasting of the thenar muscles.  Neck range of motion is full with negative Spurling's test.  Upper extremity strength and reflexes are normal.  Positive Tinel's at the ulnar groove, positive Tinel's of the carpal tunnel.  Phalen's test reproduces her symptoms.  Imaging: No results found.  Assessment & Plan: Right hand numbness, suspect carpal tunnel syndrome.  Improving with night splint. -Discussed variety of options, elected to continue with night splint.  If  symptoms worsen again, then prednisone taper. -If that does not help, then injection versus nerve studies.     Procedures: No procedures performed        PMFS History: Patient Active Problem List   Diagnosis Date Noted   Encounter for screening fecal occult blood testing 08/10/2020   Calculus of gallbladder without cholecystitis without obstruction 07/17/2020   Lower abdominal pain, unspecified 06/08/2020   Hypothyroidism 01/20/2020   Morbid obesity (HCC) 12/12/2015   Stiffness of joint, not elsewhere classified, lower leg 11/08/2013   Difficulty in walking(719.7) 11/08/2013   Pain in joint, lower leg 11/08/2013   Menorrhagia 09/07/2013   Hypertension 10/13/2012   Depression with anxiety 10/13/2012   Past Medical History:  Diagnosis Date   Anxiety    Diabetes mellitus without complication (HCC)    gestational diabetes   Hypertension    during pregnancy   Menorrhagia 09/07/2013   Has period about every 3 weeks and has cramp too    Family History  Problem Relation Age of Onset   Cancer Father        liver   Heart disease Maternal Grandmother    Anuerysm Paternal Grandfather        stomach    Past Surgical History:  Procedure Laterality Date   CESAREAN SECTION     x 2   CHOLECYSTECTOMY N/A 08/29/2020  Procedure: LAPAROSCOPIC CHOLECYSTECTOMY;  Surgeon: [name], MD;  Location: [facility];  Service: General;  Laterality: N/A;  pt knows to arrive at 8:30   DILITATION & CURRETTAGE/HYSTROSCOPY WITH THERMACHOICE ABLATION N/A 09/20/2013   Procedure: DILATATION & CURETTAGE/HYSTEROSCOPY WITH THERMACHOICE ABLATION;  Surgeon: [name], MD;  Location: [facility];  Service: Gynecology;  Laterality: N/A;  total therapy time: 9 minutes 30 seconds;  temperature:  87 degrees    TUBAL LIGATION     Social History   Occupational History   Not on file  Tobacco Use   Smoking status: Former    Packs/day: 0.50    Years: 20.00    Pack years: 10.00    Types: Cigarettes     Start date: 09/21/1994    Quit date: 02/22/2016    Years since quitting: 4.9   Smokeless tobacco: Never  Vaping Use   Vaping Use: Never used  Substance and Sexual Activity   Alcohol use: Yes    Alcohol/week: 2.0 - 3.0 standard drinks    Types: 2 - 3 Standard drinks or equivalent per week    Comment: rare   Drug use: No   Sexual activity: Yes    Birth control/protection: Surgical    Comment: tubal & ablation

## 2021-06-25 ENCOUNTER — Ambulatory Visit: Payer: Self-pay | Admitting: Family Medicine

## 2021-06-25 ENCOUNTER — Encounter: Payer: Self-pay | Admitting: Family Medicine

## 2021-06-25 ENCOUNTER — Other Ambulatory Visit: Payer: Self-pay

## 2021-06-25 VITALS — BP 122/72 | HR 86 | Temp 98.2°F | Wt 214.0 lb

## 2021-06-25 DIAGNOSIS — Z79899 Other long term (current) drug therapy: Secondary | ICD-10-CM

## 2021-06-25 DIAGNOSIS — E669 Obesity, unspecified: Secondary | ICD-10-CM

## 2021-06-25 DIAGNOSIS — E781 Pure hyperglyceridemia: Secondary | ICD-10-CM

## 2021-06-25 DIAGNOSIS — F418 Other specified anxiety disorders: Secondary | ICD-10-CM

## 2021-06-25 DIAGNOSIS — E039 Hypothyroidism, unspecified: Secondary | ICD-10-CM

## 2021-06-25 DIAGNOSIS — I10 Essential (primary) hypertension: Secondary | ICD-10-CM

## 2021-06-25 MED ORDER — QSYMIA 15-92 MG PO CP24
1.0000 | ORAL_CAPSULE | Freq: Every day | ORAL | 2 refills | Status: DC
Start: 2021-06-25 — End: 2021-12-13

## 2021-06-25 MED ORDER — BUPROPION HCL ER (XL) 300 MG PO TB24
300.0000 mg | ORAL_TABLET | Freq: Every day | ORAL | 1 refills | Status: DC
Start: 2021-06-25 — End: 2021-12-14

## 2021-06-25 MED ORDER — LEVOTHYROXINE SODIUM 75 MCG PO TABS: ORAL_TABLET | ORAL | 1 refills | Status: DC

## 2021-06-25 MED ORDER — LISINOPRIL 5 MG PO TABS: ORAL_TABLET | ORAL | 1 refills | Status: DC

## 2021-06-25 MED ORDER — TRIAMTERENE-HCTZ 75-50 MG PO TABS: 1.0000 | ORAL_TABLET | Freq: Every day | ORAL | 1 refills | Status: DC

## 2021-06-25 NOTE — Patient Instructions (Signed)
I have refilled your medications.  Consider [id number].  Follow up in 3 months with Eber Jones.  Take care  Dr. Adriana Simas

## 2021-06-26 DIAGNOSIS — E669 Obesity, unspecified: Secondary | ICD-10-CM | POA: Insufficient documentation

## 2021-06-26 NOTE — Assessment & Plan Note (Signed)
BP well controlled.  Continue lisinopril and triamterene/HCTZ.  Labs ordered.  Refilled her medications today.

## 2021-06-26 NOTE — Assessment & Plan Note (Signed)
TSH ordered.  Continue Synthroid at current dosing.  We will make adjustments if needed based off of TSH.

## 2021-06-26 NOTE — Assessment & Plan Note (Signed)
We had a lengthy discussion today regarding current treatment options for obesity.  We also discussed the potential harms of Qsymia and the fact that this cannot be used long-term.  I have given her 1 refill.  I will not give additional refills of this medication as I do not prefer this medication and it is not recommended for long-term treatment.  Additionally, there are better options available.  Patient is amendable to [facility] or [place] in the future.

## 2021-06-26 NOTE — Progress Notes (Signed)
Subjective:  Patient ID: Eileen Nunez, female    DOB: 07/18/1980  Age: 41 y.o. MRN: [id number]  CC: Chief Complaint  Patient presents with   Establish Care    HPI:  41 year old female present for follow-up.  Hypertension Stable/well-controlled on lisinopril and triamterene HCTZ. Requesting refills today.  Depression and anxiety Currently stable on Wellbutrin.  Requesting refills today.  Hypothyroidism Has been stable on 75 MCG of Synthroid.  Has not had labs in quite some time.  Needs labs.  Obesity Patient has previously been on phentermine/topiramate.  She would like to restart this today.  We will discuss other options as better options are now available.  Patient Active Problem List   Diagnosis Date Noted   Obesity (BMI 30.0-34.9) 06/26/2021   Hypothyroidism 01/20/2020   Menorrhagia 09/07/2013   Hypertension 10/13/2012   Depression with anxiety 10/13/2012    Social Hx   Social History   Socioeconomic History   Marital status: Married    Spouse name: Not on file   Number of children: Not on file   Years of education: Not on file   Highest education level: Not on file  Occupational History   Not on file  Tobacco Use   Smoking status: Former    Packs/day: 0.50    Years: 20.00    Pack years: 10.00    Types: Cigarettes    Start date: 09/21/1994    Quit date: 02/22/2016    Years since quitting: 5.3   Smokeless tobacco: Never  Vaping Use   Vaping Use: Never used  Substance and Sexual Activity   Alcohol use: Yes    Alcohol/week: 2.0 - 3.0 standard drinks    Types: 2 - 3 Standard drinks or equivalent per week    Comment: rare   Drug use: No   Sexual activity: Yes    Birth control/protection: Surgical    Comment: tubal & ablation  Other Topics Concern   Not on file  Social History Narrative   Not on file   Social Determinants of Health   Financial Resource Strain: Low Risk    Difficulty of Paying Living Expenses: Not hard at all  Food  Insecurity: No Food Insecurity   Worried About Charity fundraiser in the Last Year: Never true   Ran Out of Food in the Last Year: Never true  Transportation Needs: No Transportation Needs   Lack of Transportation (Medical): No   Lack of Transportation (Non-Medical): No  Physical Activity: Insufficiently Active   Days of Exercise per Week: 2 days   Minutes of Exercise per Session: 30 min  Stress: No Stress Concern Present   Feeling of Stress : Only a little  Social Connections: Moderately Integrated   Frequency of Communication with Friends and Family: More than three times a week   Frequency of Social Gatherings with Friends and Family: More than three times a week   Attends Religious Services: 1 to 4 times per year   Active Member of [employer] or Organizations: No   Attends Archivist Meetings: Never   Marital Status: Married    Review of Systems  Constitutional: Negative.   Respiratory: Negative.    Cardiovascular: Negative.     Objective:  BP 122/72    Pulse 86    Temp 98.2 F (36.8 C)    Wt 214 lb (97.1 kg)    SpO2 98%    BMI 30.71 kg/m   BP/Weight 06/25/2021 09/13/5571 07/25/252  Systolic BP 270  956 213  Diastolic BP 72 82 91  Wt. (Lbs) 214 206.6 -  BMI 30.71 29.64 -    Physical Exam Vitals and nursing note reviewed.  Constitutional:      General: She is not in acute distress.    Appearance: Normal appearance. She is not ill-appearing.  HENT:     Head: Normocephalic and atraumatic.  Cardiovascular:     Rate and Rhythm: Normal rate and regular rhythm.     Heart sounds: No murmur heard. Pulmonary:     Effort: Pulmonary effort is normal.     Breath sounds: Normal breath sounds. No wheezing, rhonchi or rales.  Neurological:     Mental Status: She is alert.  Psychiatric:        Mood and Affect: Mood normal.        Behavior: Behavior normal.    Lab Results  Component Value Date   WBC 11.4 (H) 06/08/2020   HGB 13.0 06/08/2020   HCT 38.0 06/08/2020    PLT 368 06/08/2020   GLUCOSE 84 06/08/2020   CHOL 177 01/19/2020   TRIG 173 (H) 01/19/2020   HDL 57 01/19/2020   LDLCALC 91 01/19/2020   ALT 49 (H) 06/08/2020   AST 27 06/08/2020   NA 137 06/08/2020   K 3.5 06/08/2020   CL 97 06/08/2020   CREATININE 1.10 (H) 08/13/2020   BUN 15 06/08/2020   CO2 23 06/08/2020   TSH 2.590 08/24/2020     Assessment & Plan:   Problem List Items Addressed This Visit       Cardiovascular and Mediastinum   Hypertension - Primary    BP well controlled.  Continue lisinopril and triamterene/HCTZ.  Labs ordered.  Refilled her medications today.      Relevant Medications   lisinopril (ZESTRIL) 5 MG tablet   triamterene-hydrochlorothiazide (MAXZIDE) 75-50 MG tablet   Other Relevant Orders   CMP14+EGFR     Endocrine   Hypothyroidism    TSH ordered.  Continue Synthroid at current dosing.  We will make adjustments if needed based off of TSH.      Relevant Medications   levothyroxine (SYNTHROID) 75 MCG tablet   Other Relevant Orders   TSH     Other   Depression with anxiety    Stable.  Continue Wellbutrin.      Relevant Medications   buPROPion (WELLBUTRIN XL) 300 MG 24 hr tablet   Obesity (BMI 30.0-34.9)    We had a lengthy discussion today regarding current treatment options for obesity.  We also discussed the potential harms of Qsymia and the fact that this cannot be used long-term.  I have given her 1 refill.  I will not give additional refills of this medication as I do not prefer this medication and it is not recommended for long-term treatment.  Additionally, there are better options available.  Patient is amendable to [facility] or [facility] in the future.      Other Visit Diagnoses     High risk medication use       Relevant Orders   CBC   Hypertriglyceridemia       Relevant Medications   lisinopril (ZESTRIL) 5 MG tablet   triamterene-hydrochlorothiazide (MAXZIDE) 75-50 MG tablet   Other Relevant Orders   Lipid panel        Meds ordered this encounter  Medications   buPROPion (WELLBUTRIN XL) 300 MG 24 hr tablet    Sig: Take 1 tablet (300 mg total) by mouth daily.  Dispense:  90 tablet    Refill:  1   levothyroxine (SYNTHROID) 75 MCG tablet    Sig: TAKE 1 TABLET(75 MCG) BY MOUTH DAILY    Dispense:  90 tablet    Refill:  1   lisinopril (ZESTRIL) 5 MG tablet    Sig: TAKE 1 TABLET(5 MG) BY MOUTH DAILY    Dispense:  90 tablet    Refill:  1   Phentermine-Topiramate (QSYMIA) 15-92 MG CP24    Sig: Take 1 capsule by mouth daily.    Dispense:  30 capsule    Refill:  2   triamterene-hydrochlorothiazide (MAXZIDE) 75-50 MG tablet    Sig: Take 1 tablet by mouth daily.    Dispense:  90 tablet    Refill:  1    Follow-up:  Return in about 3 months (around 09/23/2021).  [place]

## 2021-06-26 NOTE — Assessment & Plan Note (Signed)
Stable.  Continue Wellbutrin. 

## 2021-07-04 ENCOUNTER — Other Ambulatory Visit: Payer: Self-pay | Admitting: Nurse Practitioner

## 2021-07-06 ENCOUNTER — Other Ambulatory Visit: Payer: Self-pay | Admitting: Nurse Practitioner

## 2021-07-14 ENCOUNTER — Other Ambulatory Visit: Payer: Self-pay | Admitting: Family Medicine

## 2021-07-14 DIAGNOSIS — I10 Essential (primary) hypertension: Secondary | ICD-10-CM

## 2021-07-23 LAB — CBC
Hematocrit: 45.8 % (ref 34.0–46.6)
Hemoglobin: 16 g/dL — ABNORMAL HIGH (ref 11.1–15.9)
MCH: 32.5 pg (ref 26.6–33.0)
MCHC: 34.9 g/dL (ref 31.5–35.7)
MCV: 93 fL (ref 79–97)
Platelets: 198 10*3/uL (ref 150–450)
RBC: 4.92 x10E6/uL (ref 3.77–5.28)
RDW: 12.6 % (ref 11.7–15.4)
WBC: 7.5 10*3/uL (ref 3.4–10.8)

## 2021-07-23 LAB — CMP14+EGFR
ALT: 58 IU/L — ABNORMAL HIGH (ref 0–32)
AST: 60 IU/L — ABNORMAL HIGH (ref 0–40)
Albumin/Globulin Ratio: 1.8 (ref 1.2–2.2)
Albumin: 4.9 g/dL — ABNORMAL HIGH (ref 3.8–4.8)
Alkaline Phosphatase: 82 IU/L (ref 44–121)
BUN/Creatinine Ratio: 12 (ref 9–23)
BUN: 15 mg/dL (ref 6–24)
Bilirubin Total: 1 mg/dL (ref 0.0–1.2)
CO2: 25 mmol/L (ref 20–29)
Calcium: 9.4 mg/dL (ref 8.7–10.2)
Chloride: 99 mmol/L (ref 96–106)
Creatinine, Ser: 1.21 mg/dL — ABNORMAL HIGH (ref 0.57–1.00)
Globulin, Total: 2.7 g/dL (ref 1.5–4.5)
Glucose: 93 mg/dL (ref 70–99)
Potassium: 3.5 mmol/L (ref 3.5–5.2)
Sodium: 139 mmol/L (ref 134–144)
Total Protein: 7.6 g/dL (ref 6.0–8.5)
eGFR: 57 mL/min/{1.73_m2} — ABNORMAL LOW (ref >59)

## 2021-07-23 LAB — LIPID PANEL
Chol/HDL Ratio: 3.1 ratio (ref 0.0–4.4)
Cholesterol, Total: 192 mg/dL (ref 100–199)
HDL: 62 mg/dL (ref >39)
LDL Chol Calc (NIH): 106 mg/dL — ABNORMAL HIGH (ref 0–99)
Triglycerides: 139 mg/dL (ref 0–149)
VLDL Cholesterol Cal: 24 mg/dL (ref 5–40)

## 2021-07-23 LAB — TSH: TSH: 4.01 u[IU]/mL (ref 0.450–4.500)

## 2021-07-30 ENCOUNTER — Other Ambulatory Visit: Payer: Self-pay | Admitting: Family Medicine

## 2021-07-30 DIAGNOSIS — I10 Essential (primary) hypertension: Secondary | ICD-10-CM

## 2021-08-24 IMAGING — CT CT ABD-PELV W/ CM
2 of 5 series · 16 of 46 positions shown, 18 images · IV contrast (Omnipaque or Isovue)
Comparison: None.

CLINICAL DATA: Right lower quadrant pain, lower abdominal pain

EXAM:
CT ABDOMEN AND PELVIS WITH CONTRAST
TECHNIQUE: Multidetector CT imaging of the abdomen and pelvis was performed
using the standard protocol following bolus administration of
intravenous contrast.
CONTRAST:  100mL OMNIPAQUE IOHEXOL 300 MG/ML  SOLN

[Series 2: axial st · axial · 0.87mm/px · z∈[+412,+876]mm · 13 of 105 slices shown, 15 images]
[im 6/105  soft-tissue]
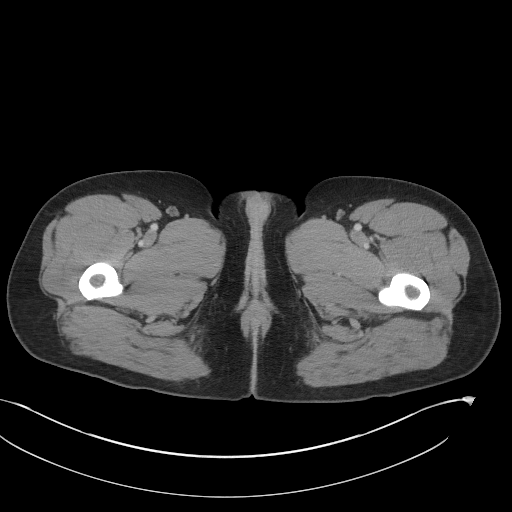
[im 6/105  bone]
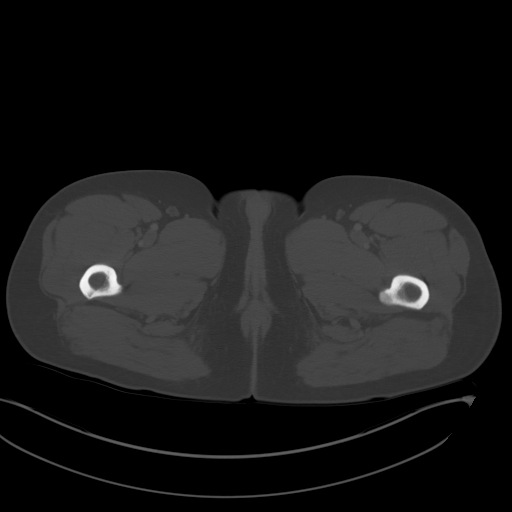
[im 16/105  soft-tissue]
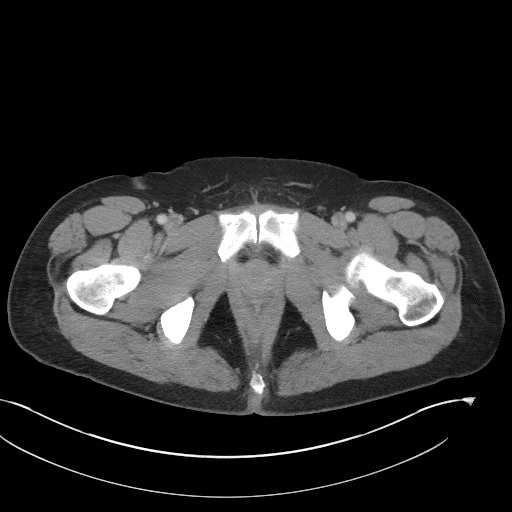
[im 21/105  soft-tissue]
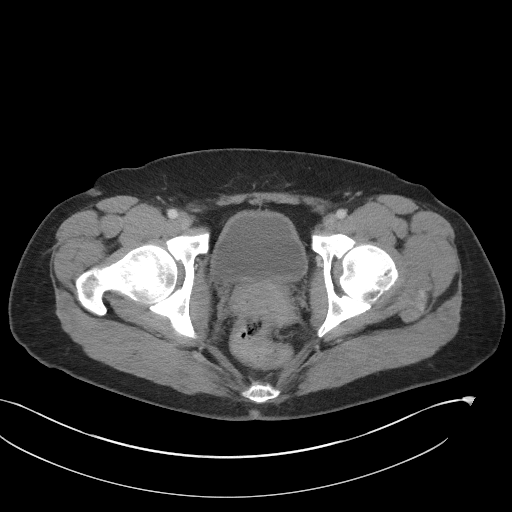
[im 32/105  soft-tissue]
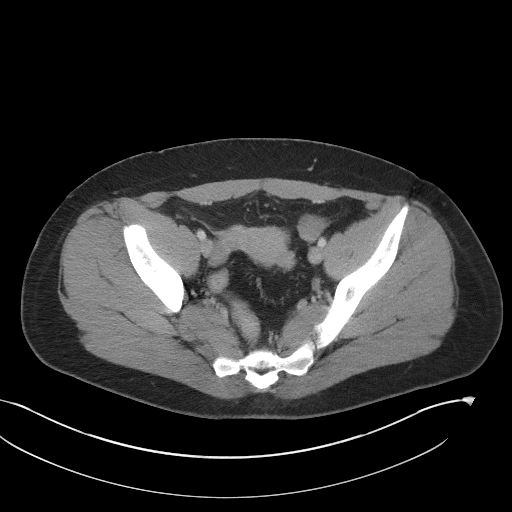
[im 37/105  soft-tissue]
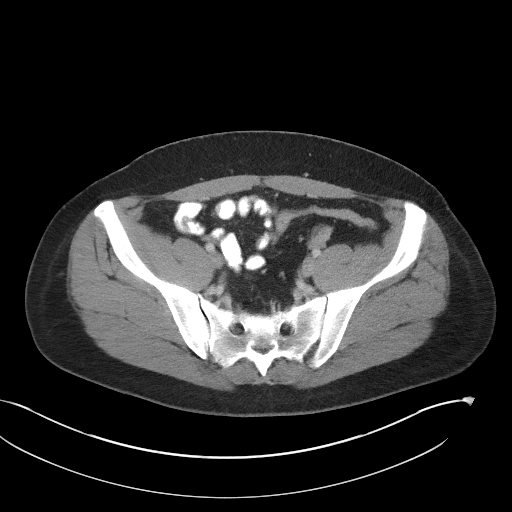
[im 47/105  soft-tissue]
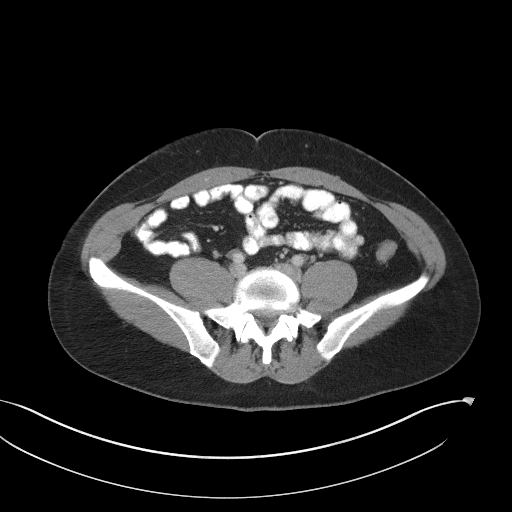
[im 53/105  soft-tissue]
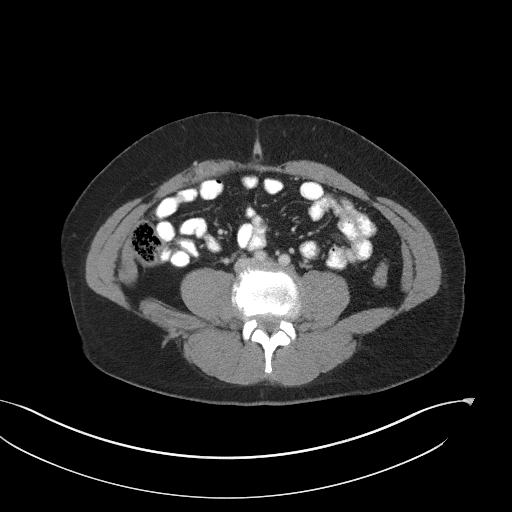
[im 58/105  soft-tissue]
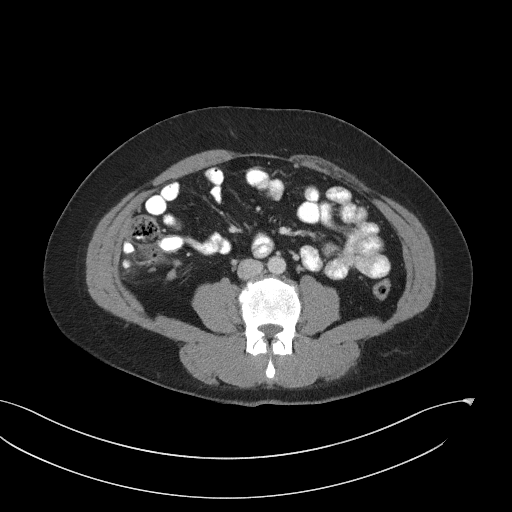
[im 68/105  soft-tissue]
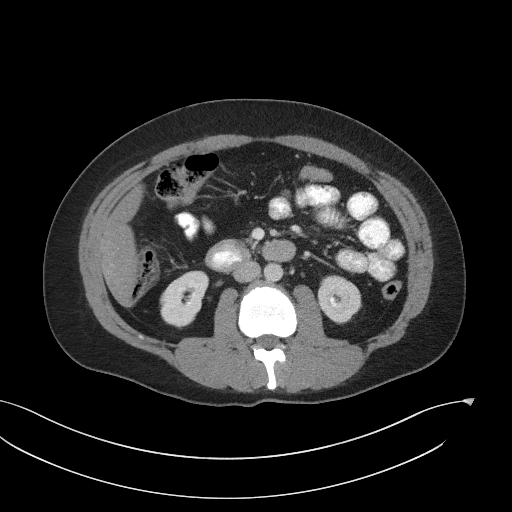
[im 68/105  bone]
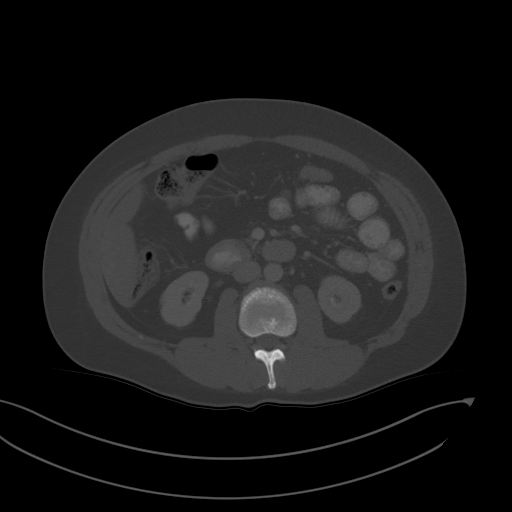
[im 73/105  soft-tissue]
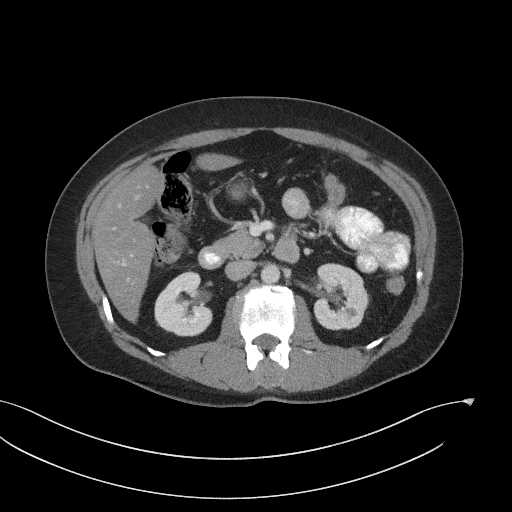
[im 84/105  soft-tissue]
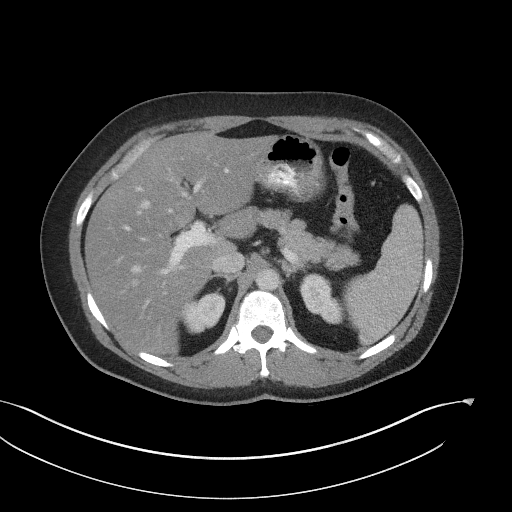
[im 89/105  soft-tissue]
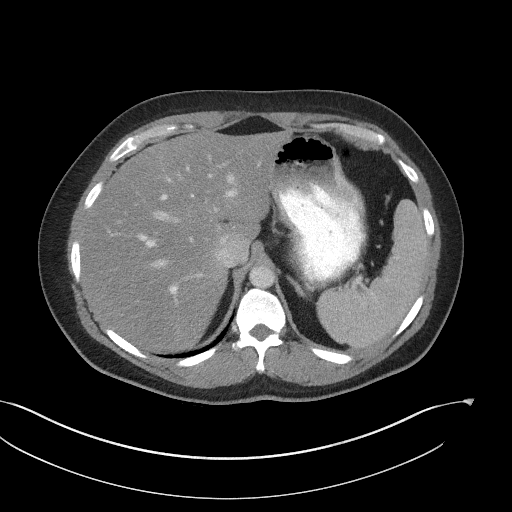
[im 99/105  soft-tissue]
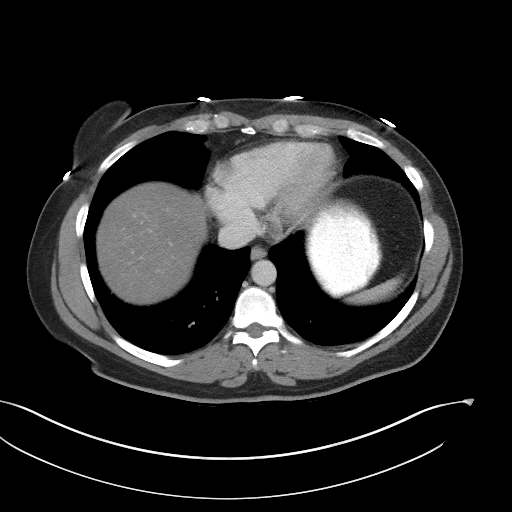

[Series 5: coronal st · coronal · 0.81mm/px · 3 of 109 slices shown]
[im 37/109  soft-tissue]
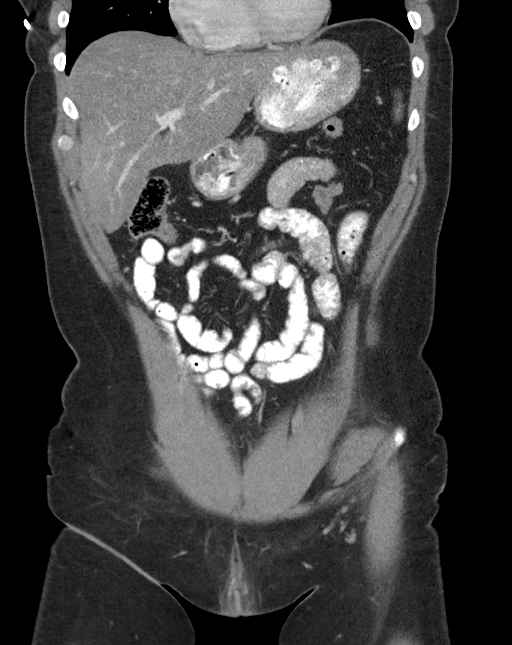
[im 49/109  soft-tissue]
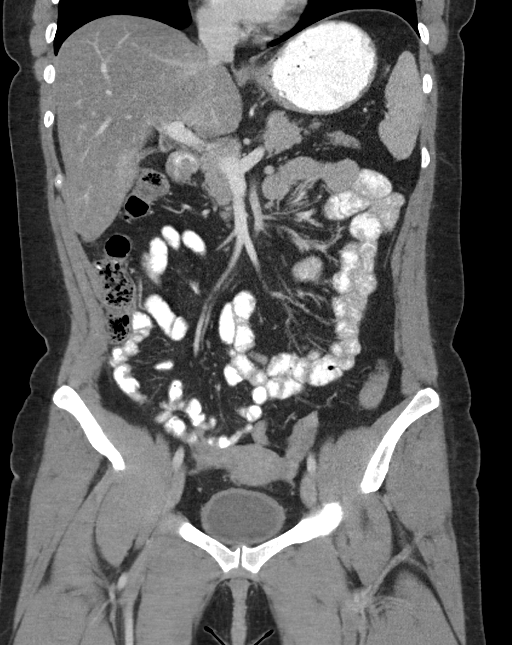
[im 61/109  soft-tissue]
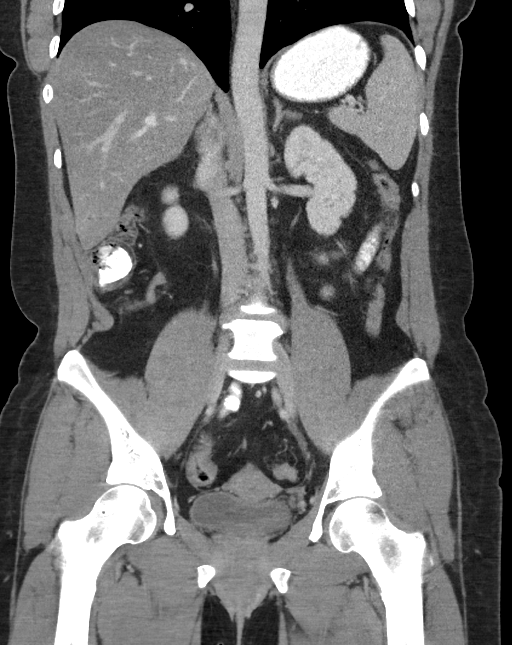

[16 of 46 positions shown; findings below may reference images not displayed]

FINDINGS: Lower chest: No acute abnormality.

Hepatobiliary: No focal liver abnormality. Gallbladder is contracted
with a 1.5 cm stone present. No biliary dilatation.

Pancreas: Unremarkable.

Spleen: Unremarkable.

Adrenals/Urinary Tract: Adrenals are unremarkable. Too small to
characterize low-attenuation renal lesions statistically reflecting
cysts. No definite renal calculi. Bladder is unremarkable.

Stomach/Bowel: Stomach is within normal limits. Mild duodenal wall
thickening likely due to underdistention. Bowel is normal in
caliber. Appendix measures top normal at 7 mm. Oral contrast has not
reached the cecum.

Vascular/Lymphatic: No significant vascular findings. No enlarged
lymph nodes.

Reproductive: Uterus and bilateral adnexa are unremarkable.

Other: No ascites.  Abdominal wall is unremarkable.

Musculoskeletal: Lumbar spine degenerative changes. No acute osseous
abnormality.
IMPRESSION: 1.5 cm gallstone.

Top-normal size appendix without secondary signs of acute
appendicitis.

## 2021-09-12 ENCOUNTER — Telehealth: Payer: Self-pay | Admitting: *Deleted

## 2021-09-12 NOTE — Telephone Encounter (Signed)
Patient's insurance plan does not cover Qsymia and there is no alternatives covered for this diagnosis per insurance([employer])- Please advise ?

## 2021-09-13 NOTE — Telephone Encounter (Signed)
Patient notified and states she pays out of pocket for this medication and wants to stay on it  ?

## 2021-09-13 NOTE — Telephone Encounter (Signed)
Eileen Sams, DO   ? ?She can follow up to discuss other options if she would like.    ? ?

## 2021-10-30 ENCOUNTER — Other Ambulatory Visit: Payer: Self-pay | Admitting: Family Medicine

## 2021-12-13 ENCOUNTER — Encounter: Payer: Self-pay | Admitting: Nurse Practitioner

## 2021-12-13 ENCOUNTER — Ambulatory Visit (INDEPENDENT_AMBULATORY_CARE_PROVIDER_SITE_OTHER): Payer: 59 | Admitting: Nurse Practitioner

## 2021-12-13 VITALS — BP 136/90 | Wt 213.6 lb

## 2021-12-13 DIAGNOSIS — E669 Obesity, unspecified: Secondary | ICD-10-CM | POA: Diagnosis not present

## 2021-12-13 DIAGNOSIS — Z79899 Other long term (current) drug therapy: Secondary | ICD-10-CM

## 2021-12-13 DIAGNOSIS — R748 Abnormal levels of other serum enzymes: Secondary | ICD-10-CM

## 2021-12-13 DIAGNOSIS — E039 Hypothyroidism, unspecified: Secondary | ICD-10-CM | POA: Diagnosis not present

## 2021-12-13 DIAGNOSIS — I10 Essential (primary) hypertension: Secondary | ICD-10-CM | POA: Diagnosis not present

## 2021-12-13 DIAGNOSIS — F418 Other specified anxiety disorders: Secondary | ICD-10-CM | POA: Diagnosis not present

## 2021-12-13 DIAGNOSIS — S39012A Strain of muscle, fascia and tendon of lower back, initial encounter: Secondary | ICD-10-CM

## 2021-12-13 MED ORDER — MELOXICAM 15 MG PO TABS: 15.0000 mg | ORAL_TABLET | Freq: Every day | ORAL | 0 refills | Status: DC

## 2021-12-13 NOTE — Progress Notes (Signed)
Subjective:    Patient ID: Eileen Nunez, female    DOB: 08-27-1980, 41 y.o.   MRN: [id number]  Hypertension This is a chronic problem. Pertinent negatives include no chest pain, palpitations or shortness of breath. There are no compliance problems.   Pt arrives for medication follow up. No issues with HTN. Pt has been having lower back pain for about 2 months. Has been using generic Advil for back pain with no relief. Pain is close to top of buttock region. Pt would like to discuss Qsymia.  Adherent to medication regimen. Does not check BP outside of office. Will need to stop Qsymia. Would like to discuss weight loss options. Healthy diet. Very active lifestyle including gardening and working out.  Physicals at GYN. Last labs through our office 07/22/21. History of slight elevation in LFTs.  C/o generalized localized low back pain for the past 2 months. No specific history of injury. Has tried chiropractic care, ice/heat, OTC Aleve, TENS unit which helps some. Describes as a intermittent dull ache worse with certain movements. Depression and anxiety stable on Buproprion.  Requesting refill on clobetasol solution for recurrent rash. Comes and goes. Not an issue today but wants to have medication on hand. Mainly occurs on arms and scalp. Last seen for rash on 07/07/18. Only uses it for a few days then stops.  No personal history of pancreatitis and no family history of endocrine cancers.  Review of Systems  Respiratory:  Negative for cough, chest tightness and shortness of breath.   Cardiovascular:  Negative for chest pain, palpitations and leg swelling.  Genitourinary:        No change in bowel or bladder habit.   Musculoskeletal:  Positive for back pain.  Neurological:  Negative for weakness and numbness.       No numbness or weakness of the lower extremities.       06/25/2021    2:04 PM  Depression screen PHQ 2/9  Decreased Interest 0  Down, Depressed, Hopeless 0  PHQ - 2 Score 0   Altered sleeping 0  Tired, decreased energy 0  Change in appetite 0  Feeling bad or failure about yourself  0  Trouble concentrating 0  Moving slowly or fidgety/restless 0  Suicidal thoughts 0  PHQ-9 Score 0  Difficult doing work/chores Not difficult at all         Objective:   Physical Exam NAD.  Alert, oriented.  Calm cheerful affect.  Making good eye contact.  Dressed appropriately for weather.  Speech clear.  Thyroid nontender to palpation, no mass or goiter noted.  Lungs clear.  Heart regular rate rhythm.  Lower extremities no edema.  Localized tenderness in the low lumbar area to palpation bilaterally.  SLR negative bilaterally.  Normal patellar reflexes.  Normal gait.  Today's Vitals   12/13/21 0935  BP: 136/90  Weight: 213 lb 9.6 oz (96.9 kg)   Body mass index is 30.65 kg/m.  See labs dated 07/22/2021.      Assessment & Plan:   Problem List Items Addressed This Visit       Cardiovascular and Mediastinum   Hypertension - Primary   Relevant Orders   CMP14+EGFR (Completed)     Endocrine   Hypothyroidism   Relevant Orders   TSH (Completed)     Musculoskeletal and Integument   Strain of lumbar region     Other   Depression with anxiety   Elevated liver enzymes   Relevant Orders   CMP14+EGFR (  Completed)   Obesity (BMI 30.0-34.9)   Other Visit Diagnoses     High risk medication use       Relevant Orders   CMP14+EGFR (Completed)      Meds ordered this encounter  Medications   meloxicam (MOBIC) 15 MG tablet    Sig: Take 1 tablet (15 mg total) by mouth daily. Prn pain    Dispense:  30 tablet    Refill:  0    Order Specific Question:   Supervising Provider    Answer:   Lilyan Punt A [[id number]]   clobetasol (TEMOVATE) 0.05 % external solution    Sig: Apply 1 Application topically 2 (two) times daily. prn    Dispense:  50 mL    Refill:  0    Order Specific Question:   Supervising Provider    Answer:   Lilyan Punt A [[id number]]   buPROPion  (WELLBUTRIN XL) 300 MG 24 hr tablet    Sig: Take 1 tablet (300 mg total) by mouth daily.    Dispense:  90 tablet    Refill:  1    Order Specific Question:   Supervising Provider    Answer:   Lilyan Punt A [[id number]]   levothyroxine (SYNTHROID) 75 MCG tablet    Sig: TAKE 1 TABLET(75 MCG) BY MOUTH DAILY    Dispense:  90 tablet    Refill:  1    Order Specific Question:   Supervising Provider    Answer:   Lilyan Punt A [[id number]]   lisinopril (ZESTRIL) 5 MG tablet    Sig: TAKE 1 TABLET(5 MG) BY MOUTH DAILY    Dispense:  90 tablet    Refill:  1    Order Specific Question:   Supervising Provider    Answer:   Lilyan Punt A [[id number]]   triamterene-hydrochlorothiazide (MAXZIDE) 75-50 MG tablet    Sig: Take 1 tablet by mouth daily.    Dispense:  90 tablet    Refill:  1    Order Specific Question:   Supervising Provider    Answer:   Lilyan Punt A [[id number]]   Continue current medications as directed. Patient to research Bernie Covey and Reginal Lutes to see if her insurance will cover these.  We will get back to the office if she decides she wants 1 of these prescribed. Refill clobetasol external solution.  Reminded patient not to use this more than a few days at a time due to possible skin changes.  Verbalizes understanding. Switch to Mobic as directed as needed pain.  Continue TENS unit, ice/heat applications.  Also given samples of Voltaren topical as directed.  Given copy of back exercises for low back pain. Call back in 2 to 3 weeks if back pain persist, sooner if worse. Repeat labs to recheck liver profile, GFR, potassium and TSH. Return in about 6 months (around 06/14/2022).

## 2021-12-14 ENCOUNTER — Encounter: Payer: Self-pay | Admitting: Nurse Practitioner

## 2021-12-14 DIAGNOSIS — R748 Abnormal levels of other serum enzymes: Secondary | ICD-10-CM | POA: Insufficient documentation

## 2021-12-14 DIAGNOSIS — S39012A Strain of muscle, fascia and tendon of lower back, initial encounter: Secondary | ICD-10-CM | POA: Insufficient documentation

## 2021-12-14 LAB — CMP14+EGFR
ALT: 58 IU/L — ABNORMAL HIGH (ref 0–32)
AST: 44 IU/L — ABNORMAL HIGH (ref 0–40)
Albumin/Globulin Ratio: 1.7 (ref 1.2–2.2)
Albumin: 4.5 g/dL (ref 3.8–4.8)
Alkaline Phosphatase: 75 IU/L (ref 44–121)
BUN/Creatinine Ratio: 14 (ref 9–23)
BUN: 16 mg/dL (ref 6–24)
Bilirubin Total: 1.1 mg/dL (ref 0.0–1.2)
CO2: 25 mmol/L (ref 20–29)
Calcium: 9.2 mg/dL (ref 8.7–10.2)
Chloride: 99 mmol/L (ref 96–106)
Creatinine, Ser: 1.15 mg/dL — ABNORMAL HIGH (ref 0.57–1.00)
Globulin, Total: 2.7 g/dL (ref 1.5–4.5)
Glucose: 91 mg/dL (ref 70–99)
Potassium: 3.9 mmol/L (ref 3.5–5.2)
Sodium: 139 mmol/L (ref 134–144)
Total Protein: 7.2 g/dL (ref 6.0–8.5)
eGFR: 61 mL/min/{1.73_m2} (ref >59)

## 2021-12-14 LAB — TSH: TSH: 4.02 u[IU]/mL (ref 0.450–4.500)

## 2021-12-14 MED ORDER — LISINOPRIL 5 MG PO TABS: ORAL_TABLET | ORAL | 1 refills | Status: DC

## 2021-12-14 MED ORDER — BUPROPION HCL ER (XL) 300 MG PO TB24: 300.0000 mg | ORAL_TABLET | Freq: Every day | ORAL | 1 refills | Status: DC

## 2021-12-14 MED ORDER — CLOBETASOL PROPIONATE 0.05 % EX SOLN: 1.0000 | Freq: Two times a day (BID) | CUTANEOUS | 0 refills | Status: DC

## 2021-12-14 MED ORDER — TRIAMTERENE-HCTZ 75-50 MG PO TABS: 1.0000 | ORAL_TABLET | Freq: Every day | ORAL | 1 refills | Status: DC

## 2021-12-14 MED ORDER — LEVOTHYROXINE SODIUM 75 MCG PO TABS: ORAL_TABLET | ORAL | 1 refills | Status: DC

## 2021-12-16 ENCOUNTER — Encounter: Payer: Self-pay | Admitting: Nurse Practitioner

## 2022-01-12 ENCOUNTER — Other Ambulatory Visit: Payer: Self-pay | Admitting: Nurse Practitioner

## 2022-01-14 MED ORDER — MELOXICAM 15 MG PO TABS: ORAL_TABLET | ORAL | 0 refills | Status: DC

## 2022-01-14 NOTE — Addendum Note (Signed)
Addended by: Margaretha Sheffield on: 01/14/2022 08:02 AM   Modules accepted: Orders

## 2022-04-18 ENCOUNTER — Other Ambulatory Visit: Payer: Self-pay | Admitting: Family Medicine

## 2022-04-28 ENCOUNTER — Other Ambulatory Visit: Payer: Self-pay | Admitting: Family Medicine

## 2022-04-28 DIAGNOSIS — I10 Essential (primary) hypertension: Secondary | ICD-10-CM

## 2022-06-10 ENCOUNTER — Other Ambulatory Visit: Payer: Self-pay | Admitting: Nurse Practitioner

## 2022-06-17 MED ORDER — CLOBETASOL PROPIONATE 0.05 % EX SOLN: 1.0000 | Freq: Two times a day (BID) | CUTANEOUS | 0 refills | Status: DC

## 2022-06-25 ENCOUNTER — Other Ambulatory Visit: Payer: Self-pay

## 2022-06-25 MED ORDER — TRIAMTERENE-HCTZ 75-50 MG PO TABS: 1.0000 | ORAL_TABLET | Freq: Every day | ORAL | 1 refills | Status: DC

## 2022-07-28 ENCOUNTER — Other Ambulatory Visit: Payer: Self-pay | Admitting: *Deleted

## 2022-07-28 MED ORDER — BUPROPION HCL ER (XL) 300 MG PO TB24: 300.0000 mg | ORAL_TABLET | Freq: Every day | ORAL | 0 refills | Status: DC

## 2022-09-09 ENCOUNTER — Other Ambulatory Visit: Payer: Self-pay | Admitting: Family Medicine

## 2022-10-04 ENCOUNTER — Other Ambulatory Visit: Payer: Self-pay | Admitting: Family Medicine

## 2022-10-06 ENCOUNTER — Other Ambulatory Visit: Payer: Self-pay | Admitting: Family Medicine

## 2022-10-13 ENCOUNTER — Encounter: Payer: Self-pay | Admitting: Nurse Practitioner

## 2022-10-17 ENCOUNTER — Other Ambulatory Visit: Payer: Self-pay | Admitting: Nurse Practitioner

## 2022-10-17 DIAGNOSIS — I10 Essential (primary) hypertension: Secondary | ICD-10-CM

## 2022-10-17 MED ORDER — TRIAMTERENE-HCTZ 75-50 MG PO TABS: 1.0000 | ORAL_TABLET | Freq: Every day | ORAL | 0 refills | Status: DC

## 2022-10-17 MED ORDER — LEVOTHYROXINE SODIUM 75 MCG PO TABS: ORAL_TABLET | ORAL | 0 refills | Status: DC

## 2022-10-17 MED ORDER — BUPROPION HCL ER (XL) 300 MG PO TB24: ORAL_TABLET | ORAL | 0 refills | Status: DC

## 2022-10-17 MED ORDER — LISINOPRIL 5 MG PO TABS: ORAL_TABLET | ORAL | 0 refills | Status: DC

## 2022-10-31 ENCOUNTER — Ambulatory Visit (INDEPENDENT_AMBULATORY_CARE_PROVIDER_SITE_OTHER): Payer: 59 | Admitting: Nurse Practitioner

## 2022-10-31 ENCOUNTER — Encounter: Payer: Self-pay | Admitting: Nurse Practitioner

## 2022-10-31 VITALS — BP 109/75 | HR 84 | Temp 97.7°F | Ht 69.0 in | Wt 203.0 lb

## 2022-10-31 DIAGNOSIS — R748 Abnormal levels of other serum enzymes: Secondary | ICD-10-CM

## 2022-10-31 DIAGNOSIS — Z23 Encounter for immunization: Secondary | ICD-10-CM

## 2022-10-31 DIAGNOSIS — I10 Essential (primary) hypertension: Secondary | ICD-10-CM | POA: Diagnosis not present

## 2022-10-31 DIAGNOSIS — S80811A Abrasion, right lower leg, initial encounter: Secondary | ICD-10-CM | POA: Diagnosis not present

## 2022-10-31 DIAGNOSIS — E66811 Obesity, class 1: Secondary | ICD-10-CM

## 2022-10-31 DIAGNOSIS — F418 Other specified anxiety disorders: Secondary | ICD-10-CM

## 2022-10-31 DIAGNOSIS — E039 Hypothyroidism, unspecified: Secondary | ICD-10-CM | POA: Diagnosis not present

## 2022-10-31 DIAGNOSIS — Z130 Encounter for screening for diseases of the blood and blood-forming organs and certain disorders involving the immune mechanism: Secondary | ICD-10-CM

## 2022-10-31 DIAGNOSIS — Z13220 Encounter for screening for lipoid disorders: Secondary | ICD-10-CM

## 2022-10-31 DIAGNOSIS — E669 Obesity, unspecified: Secondary | ICD-10-CM

## 2022-10-31 DIAGNOSIS — S39012D Strain of muscle, fascia and tendon of lower back, subsequent encounter: Secondary | ICD-10-CM

## 2022-10-31 MED ORDER — LISINOPRIL 5 MG PO TABS
ORAL_TABLET | ORAL | 0 refills | Status: DC
Start: 2022-10-31 — End: 2022-10-31

## 2022-10-31 MED ORDER — TRIAMTERENE-HCTZ 75-50 MG PO TABS: 1.0000 | ORAL_TABLET | Freq: Every day | ORAL | 0 refills | Status: DC

## 2022-10-31 MED ORDER — LEVOTHYROXINE SODIUM 75 MCG PO TABS: ORAL_TABLET | ORAL | 0 refills | Status: DC

## 2022-10-31 MED ORDER — LEVOTHYROXINE SODIUM 75 MCG PO TABS: ORAL_TABLET | ORAL | 1 refills | Status: DC

## 2022-10-31 MED ORDER — BUPROPION HCL ER (XL) 300 MG PO TB24: ORAL_TABLET | ORAL | 0 refills | Status: DC

## 2022-10-31 MED ORDER — TRIAMTERENE-HCTZ 75-50 MG PO TABS: 1.0000 | ORAL_TABLET | Freq: Every day | ORAL | 1 refills | Status: DC

## 2022-10-31 MED ORDER — LISINOPRIL 5 MG PO TABS: ORAL_TABLET | ORAL | 1 refills | Status: DC

## 2022-10-31 MED ORDER — BUPROPION HCL ER (XL) 300 MG PO TB24: ORAL_TABLET | ORAL | 1 refills | Status: DC

## 2022-10-31 MED ORDER — METHOCARBAMOL 750 MG PO TABS: 750.0000 mg | ORAL_TABLET | Freq: Three times a day (TID) | ORAL | 0 refills | Status: DC | PRN

## 2022-11-01 ENCOUNTER — Encounter: Payer: Self-pay | Admitting: Nurse Practitioner

## 2022-11-01 NOTE — Progress Notes (Signed)
Subjective:    Patient ID: Eileen Nunez, female    DOB: 07-01-1980, 42 y.o.   MRN: [id number]  HPI Presents for routine follow-up of her blood pressure and depression/anxiety.  Current medication regimen is working well.  Does not check her blood pressure outside of the office.  Has been working on her weight loss.  Began taking semaglutide through another provider in February.  Reports her weight at that time as 221 pounds. Tries to stay active.  Has occasional problems with low back pain with right sided sciatica.  Does not occur often but requests a prescription for Robaxin which helps relieve her symptoms.  Denies any change in bowel or bladder habits.  Has not identified any specific triggers for the back pain, defers any further workup at this time since it is not persistent. Gets regular preventive health physicals with gynecology.  Plans to get her mammogram done through that office.  Review of Systems  Constitutional:  Positive for appetite change. Negative for activity change.  HENT:  Negative for sore throat and trouble swallowing.   Respiratory:  Negative for cough, chest tightness and shortness of breath.   Cardiovascular:  Negative for chest pain and leg swelling.  Gastrointestinal:  Positive for anal bleeding.      10/31/2022   10:26 AM  Depression screen PHQ 2/9  Decreased Interest 0  Down, Depressed, Hopeless 0  PHQ - 2 Score 0  Altered sleeping 0  Tired, decreased energy 2  Change in appetite 0  Feeling bad or failure about yourself  0  Trouble concentrating 0  Moving slowly or fidgety/restless 0  Suicidal thoughts 0  PHQ-9 Score 2      10/31/2022   10:26 AM 08/10/2020    9:38 AM  GAD 7 : Generalized Anxiety Score  Nervous, Anxious, on Edge 0 0  Control/stop worrying 1 0  Worry too much - different things 0 0  Trouble relaxing 0 0  Restless 0 0  Easily annoyed or irritable 0 1  Afraid - awful might happen 0 0  Total GAD 7 Score 1 1          Objective:   Physical Exam NAD.  Alert, oriented.  Calm cheerful affect.  Thyroid nontender to palpation, no mass or goiter noted.  Lungs clear.  Heart regular rate rhythm.  Lower extremities no edema.  No abnormal gait.  No difficulty moving from the chair to the table.  Superficial scratch noted on the right lower leg, no evidence of infection.  Tetanus vaccine status unknown. Today's Vitals   10/31/22 1023  BP: 109/75  Pulse: 84  Temp: 97.7 F (36.5 C)  SpO2: 99%  Weight: 203 lb (92.1 kg)  Height: 5\' 9"  (1.753 m)   Body mass index is 29.98 kg/m.        Assessment & Plan:   Problem List Items Addressed This Visit       Cardiovascular and Mediastinum   Hypertension - Primary   Relevant Medications   lisinopril (ZESTRIL) 5 MG tablet   triamterene-hydrochlorothiazide (MAXZIDE) 75-50 MG tablet   Other Relevant Orders   Comprehensive metabolic panel     Endocrine   Hypothyroidism   Relevant Medications   levothyroxine (SYNTHROID) 75 MCG tablet   Other Relevant Orders   TSH     Musculoskeletal and Integument   Strain of lumbar region     Other   Depression with anxiety   Relevant Medications   buPROPion (WELLBUTRIN XL) 300  MG 24 hr tablet   Elevated liver enzymes   Relevant Orders   Comprehensive metabolic panel   Obesity (BMI 16.1-09.6)   Other Visit Diagnoses     Abrasion, right lower leg, initial encounter       Screening for iron deficiency anemia       Relevant Orders   CBC with Differential/Platelet   Screening for lipid disorders       Relevant Orders   Lipid panel   Need for vaccination       Relevant Orders   Tdap vaccine greater than or equal to 7yo IM (Completed)      Meds ordered this encounter  Medications   buPROPion (WELLBUTRIN XL) 300 MG 24 hr tablet    Sig: TAKE 1 TABLET(300 MG) BY MOUTH DAILY    Dispense:  90 tablet    Refill:  1    Order Specific Question:   Supervising Provider    Answer:   Lilyan Punt A [[id number]]    levothyroxine (SYNTHROID) 75 MCG tablet    Sig: TAKE 1 TABLET(75 MCG) BY MOUTH DAILY    Dispense:  90 tablet    Refill:  1    Order Specific Question:   Supervising Provider    Answer:   Lilyan Punt A [[id number]]   lisinopril (ZESTRIL) 5 MG tablet    Sig: TAKE 1 TABLET(5 MG) BY MOUTH DAILY    Dispense:  90 tablet    Refill:  1    Order Specific Question:   Supervising Provider    Answer:   Lilyan Punt A [[id number]]   triamterene-hydrochlorothiazide (MAXZIDE) 75-50 MG tablet    Sig: Take 1 tablet by mouth daily.    Dispense:  90 tablet    Refill:  1    Order Specific Question:   Supervising Provider    Answer:   Lilyan Punt A [[id number]]   methocarbamol (ROBAXIN) 750 MG tablet    Sig: Take 1 tablet (750 mg total) by mouth every 8 (eight) hours as needed for muscle spasms.    Dispense:  30 tablet    Refill:  0    Order Specific Question:   Supervising Provider    Answer:   Lilyan Punt A [[id number]]   Continue current medications as directed.  Encouraged healthy diet and regular activity.  Also discussed importance of resistance training especially with use of semaglutide. Prescription sent in for Robaxin to use as needed for back pain.  Recheck if this worsens or becomes more frequent. Tetanus booster today. Routine labs ordered. Return in about 6 months (around 05/03/2023).

## 2022-12-04 ENCOUNTER — Encounter: Payer: Self-pay | Admitting: Nurse Practitioner

## 2022-12-08 ENCOUNTER — Telehealth: Payer: Self-pay

## 2022-12-08 MED ORDER — CLOBETASOL PROPIONATE 0.05 % EX SOLN: 1.0000 | Freq: Two times a day (BID) | CUTANEOUS | 0 refills | Status: DC

## 2022-12-08 NOTE — Telephone Encounter (Signed)
Prescription Request  12/08/2022  LOV: Visit date not found  What is the name of the medication or equipment?   clobetasol (TEMOVATE) 0.05 % external solution    Have you contacted your pharmacy to request a refill? Yes   Which pharmacy would you like this sent to?  [facility] [phone] - [place], [place] - [address] AT [facility] OF [address] [place] [address] Phone: [phone] Fax: [phone]    Patient notified that their request is being sent to the clinical staff for review and that they should receive a response within 2 business days.   Please advise at Mobile [phone] (mobile)

## 2022-12-09 ENCOUNTER — Encounter: Payer: Self-pay | Admitting: Nurse Practitioner

## 2022-12-09 ENCOUNTER — Other Ambulatory Visit: Payer: Self-pay | Admitting: Nurse Practitioner

## 2023-02-10 ENCOUNTER — Telehealth: Payer: Self-pay

## 2023-02-10 ENCOUNTER — Other Ambulatory Visit: Payer: Self-pay | Admitting: Nurse Practitioner

## 2023-02-10 MED ORDER — CLOBETASOL PROPIONATE 0.05 % EX SOLN: 1.0000 | Freq: Two times a day (BID) | CUTANEOUS | 0 refills | Status: DC

## 2023-02-10 NOTE — Telephone Encounter (Signed)
Prescription Request  02/10/2023  LOV: Visit date not found  What is the name of the medication or equipment? clobetasol (TEMOVATE) 0.05 % external solution   Have you contacted your pharmacy to request a refill? Yes   Which pharmacy would you like this sent to?  [facility] [phone] - [place], [place] - [address] AT [facility] OF [address] [place] [address] Phone: [phone] Fax: [phone]    Patient notified that their request is being sent to the clinical staff for review and that they should receive a response within 2 business days.   Please advise at Mobile [phone] (mobile)

## 2023-06-16 ENCOUNTER — Telehealth: Payer: Self-pay | Admitting: Family Medicine

## 2023-06-16 ENCOUNTER — Other Ambulatory Visit: Payer: Self-pay

## 2023-06-16 MED ORDER — CLOBETASOL PROPIONATE 0.05 % EX SOLN: 1.0000 | Freq: Two times a day (BID) | CUTANEOUS | 0 refills | Status: DC

## 2023-06-16 NOTE — Telephone Encounter (Signed)
Refill on  clobetasol prop 0.05 send to [facility]

## 2023-06-16 NOTE — Telephone Encounter (Signed)
1 refill follow-up with Dr. Adriana Simas with further refills or doing an office visit if any ongoing troubles

## 2023-06-26 ENCOUNTER — Other Ambulatory Visit: Payer: Self-pay | Admitting: Nurse Practitioner

## 2023-06-26 ENCOUNTER — Telehealth: Payer: Self-pay | Admitting: *Deleted

## 2023-06-26 ENCOUNTER — Other Ambulatory Visit: Payer: Self-pay | Admitting: Family Medicine

## 2023-06-26 ENCOUNTER — Encounter: Payer: Self-pay | Admitting: *Deleted

## 2023-06-26 DIAGNOSIS — E039 Hypothyroidism, unspecified: Secondary | ICD-10-CM

## 2023-06-26 DIAGNOSIS — I10 Essential (primary) hypertension: Secondary | ICD-10-CM

## 2023-06-26 MED ORDER — TRIAMTERENE-HCTZ 75-50 MG PO TABS: 1.0000 | ORAL_TABLET | Freq: Every day | ORAL | 0 refills | Status: DC

## 2023-06-26 MED ORDER — LISINOPRIL 5 MG PO TABS
ORAL_TABLET | ORAL | 0 refills | Status: DC
Start: 2023-06-26 — End: 2023-09-23

## 2023-06-26 MED ORDER — BUPROPION HCL ER (XL) 300 MG PO TB24: ORAL_TABLET | ORAL | 0 refills | Status: DC

## 2023-06-26 MED ORDER — LEVOTHYROXINE SODIUM 75 MCG PO TABS: ORAL_TABLET | ORAL | 0 refills | Status: DC

## 2023-06-26 NOTE — Telephone Encounter (Signed)
 Copied from CRM [id number]. Topic: Clinical - Medication Refill >> Jun 26, 2023  2:19 PM [name] wrote: Most Recent Primary Care Visit:  Provider: HOSKINS, CAROLYN C  Department: [facility]  Visit Type: OFFICE VISIT  Date: 10/31/2022  Medication: buPROPion  (WELLBUTRIN  XL) 300 MG 24 hr tablet  triamterene -hydrochlorothiazide (MAXZIDE) 75-50 MG tablet lisinopril  (ZESTRIL ) 5 MG tablet levothyroxine  (SYNTHROID ) 75 MCG tablet  Has the patient contacted their pharmacy? Yes, they informed the patient that the request was denied. (Agent: If no, request that the patient contact the pharmacy for the refill. If patient does not wish to contact the pharmacy document the reason why and proceed with request.) (Agent: If yes, when and what did the pharmacy advise?)  Is this the correct pharmacy for this prescription? Yes If no, delete pharmacy and type the correct one.  This is the patient's preferred pharmacy:  [facility] [phone] - [place], [place] - [address] AT [facility] OF [address] [place] [address] Phone: [phone] Fax: [phone]   Has the prescription been filled recently? No, the patient states she receives a 90-day supply.  Is the patient out of the medication? Yes  Has the patient been seen for an appointment in the last year OR does the patient have an upcoming appointment? No  Can we respond through MyChart? Yes  Agent: Please be advised that Rx refills may take up to 3 business days. We ask that you follow-up with your pharmacy.

## 2023-06-26 NOTE — Telephone Encounter (Signed)
 Patient notified via mychart

## 2023-06-26 NOTE — Telephone Encounter (Signed)
 Campbell Riches, NP     Please reorder medications from May 2024. I reordered her meds for 90 days but need her labs done especially for thyroid medication. Thanks.

## 2023-07-11 LAB — LIPID PANEL
Chol/HDL Ratio: 2.9 {ratio} (ref 0.0–4.4)
Cholesterol, Total: 194 mg/dL (ref 100–199)
HDL: 67 mg/dL (ref >39)
LDL Chol Calc (NIH): 109 mg/dL — ABNORMAL HIGH (ref 0–99)
Triglycerides: 103 mg/dL (ref 0–149)
VLDL Cholesterol Cal: 18 mg/dL (ref 5–40)

## 2023-07-11 LAB — TSH: TSH: 4.66 u[IU]/mL — ABNORMAL HIGH (ref 0.450–4.500)

## 2023-07-13 ENCOUNTER — Encounter: Payer: Self-pay | Admitting: Nurse Practitioner

## 2023-07-14 ENCOUNTER — Other Ambulatory Visit: Payer: Self-pay | Admitting: Nurse Practitioner

## 2023-07-14 DIAGNOSIS — E039 Hypothyroidism, unspecified: Secondary | ICD-10-CM

## 2023-07-14 DIAGNOSIS — Z79899 Other long term (current) drug therapy: Secondary | ICD-10-CM

## 2023-07-14 MED ORDER — LEVOTHYROXINE SODIUM 88 MCG PO TABS: 88.0000 ug | ORAL_TABLET | Freq: Every day | ORAL | 0 refills | Status: DC

## 2023-09-23 ENCOUNTER — Other Ambulatory Visit: Payer: Self-pay

## 2023-09-23 DIAGNOSIS — I10 Essential (primary) hypertension: Secondary | ICD-10-CM

## 2023-09-23 MED ORDER — LISINOPRIL 5 MG PO TABS: ORAL_TABLET | ORAL | 0 refills | Status: DC

## 2023-09-23 MED ORDER — TRIAMTERENE-HCTZ 75-50 MG PO TABS: 1.0000 | ORAL_TABLET | Freq: Every day | ORAL | 0 refills | Status: DC

## 2023-09-24 ENCOUNTER — Encounter: Payer: Self-pay | Admitting: Nurse Practitioner

## 2023-09-24 MED ORDER — BUPROPION HCL ER (XL) 300 MG PO TB24: ORAL_TABLET | ORAL | 0 refills | Status: DC

## 2023-09-29 ENCOUNTER — Other Ambulatory Visit: Payer: Self-pay | Admitting: Nurse Practitioner

## 2023-09-29 ENCOUNTER — Telehealth: Payer: Self-pay

## 2023-09-29 DIAGNOSIS — R748 Abnormal levels of other serum enzymes: Secondary | ICD-10-CM

## 2023-09-29 DIAGNOSIS — R5383 Other fatigue: Secondary | ICD-10-CM

## 2023-09-29 DIAGNOSIS — E039 Hypothyroidism, unspecified: Secondary | ICD-10-CM

## 2023-09-29 DIAGNOSIS — I10 Essential (primary) hypertension: Secondary | ICD-10-CM

## 2023-09-29 NOTE — Telephone Encounter (Signed)
 Prescription Request  09/29/2023  LOV: Visit date not found  What is the name of the medication or equipment? clobetasol (TEMOVATE) 0.05 % external solution   Have you contacted your pharmacy to request a refill? Yes   Which pharmacy would you like this sent to?  [facility] [phone] - [place], [place] - [address] AT [facility] [address] [place] [address] Phone: [phone] Fax: [phone]    Patient notified that their request is being sent to the clinical staff for review and that they should receive a response within 2 business days.   Please advise at Mobile [phone] (mobile)

## 2023-09-30 ENCOUNTER — Other Ambulatory Visit: Payer: Self-pay | Admitting: Family Medicine

## 2023-09-30 MED ORDER — CLOBETASOL PROPIONATE 0.05 % EX SOLN: 1.0000 | Freq: Two times a day (BID) | CUTANEOUS | 0 refills | Status: DC

## 2023-09-30 NOTE — Telephone Encounter (Signed)
Cook, Jayce G, DO     Done   

## 2023-10-12 ENCOUNTER — Telehealth: Payer: Self-pay

## 2023-10-12 ENCOUNTER — Other Ambulatory Visit: Payer: Self-pay

## 2023-10-12 MED ORDER — LEVOTHYROXINE SODIUM 88 MCG PO TABS: 88.0000 ug | ORAL_TABLET | Freq: Every day | ORAL | 0 refills | Status: DC

## 2023-10-12 NOTE — Telephone Encounter (Signed)
 Error

## 2023-10-12 NOTE — Telephone Encounter (Signed)
 Prescription Request  10/12/2023  LOV: Visit date not found  What is the name of the medication or equipment? levothyroxine  (SYNTHROID ) 88 MCG tablet   Have you contacted your pharmacy to request a refill? Yes   Which pharmacy would you like this sent to?  [facility] [phone] - [place] [place] - [address] AT [facility] OF [address] [place] [address] Phone: [phone] Fax: [phone]    Patient notified that their request is being sent to the clinical staff for review and that they should receive a response within 2 business days.   Please advise at Mobile [phone] (mobile)

## 2023-10-26 ENCOUNTER — Other Ambulatory Visit: Payer: Self-pay

## 2023-10-26 MED ORDER — BUPROPION HCL ER (XL) 300 MG PO TB24: ORAL_TABLET | ORAL | 0 refills | Status: DC

## 2023-10-31 LAB — CBC WITH DIFFERENTIAL/PLATELET
Basophils Absolute: 0.1 10*3/uL (ref 0.0–0.2)
Basos: 1 %
EOS (ABSOLUTE): 0.2 10*3/uL (ref 0.0–0.4)
Eos: 2 %
Hematocrit: 46 % (ref 34.0–46.6)
Hemoglobin: 15.8 g/dL (ref 11.1–15.9)
Immature Grans (Abs): 0 10*3/uL (ref 0.0–0.1)
Immature Granulocytes: 0 %
Lymphocytes Absolute: 1.7 10*3/uL (ref 0.7–3.1)
Lymphs: 19 %
MCH: 32.8 pg (ref 26.6–33.0)
MCHC: 34.3 g/dL (ref 31.5–35.7)
MCV: 95 fL (ref 79–97)
Monocytes Absolute: 0.6 10*3/uL (ref 0.1–0.9)
Monocytes: 6 %
Neutrophils Absolute: 6.6 10*3/uL (ref 1.4–7.0)
Neutrophils: 72 %
Platelets: 221 10*3/uL (ref 150–450)
RBC: 4.82 x10E6/uL (ref 3.77–5.28)
RDW: 13.1 % (ref 11.7–15.4)
WBC: 9.2 10*3/uL (ref 3.4–10.8)

## 2023-10-31 LAB — COMPREHENSIVE METABOLIC PANEL WITH GFR
ALT: 31 IU/L (ref 0–32)
AST: 24 IU/L (ref 0–40)
Albumin: 4.7 g/dL (ref 3.9–4.9)
Alkaline Phosphatase: 75 IU/L (ref 44–121)
BUN/Creatinine Ratio: 12 (ref 9–23)
BUN: 14 mg/dL (ref 6–24)
Bilirubin Total: 1.4 mg/dL — ABNORMAL HIGH (ref 0.0–1.2)
CO2: 24 mmol/L (ref 20–29)
Calcium: 9.7 mg/dL (ref 8.7–10.2)
Chloride: 98 mmol/L (ref 96–106)
Creatinine, Ser: 1.15 mg/dL — ABNORMAL HIGH (ref 0.57–1.00)
Globulin, Total: 2.8 g/dL (ref 1.5–4.5)
Glucose: 88 mg/dL (ref 70–99)
Potassium: 4.1 mmol/L (ref 3.5–5.2)
Sodium: 137 mmol/L (ref 134–144)
Total Protein: 7.5 g/dL (ref 6.0–8.5)
eGFR: 60 mL/min/{1.73_m2} (ref >59)

## 2023-10-31 LAB — T4, FREE: Free T4: 1.06 ng/dL (ref 0.82–1.77)

## 2023-10-31 LAB — TSH: TSH: 2.49 u[IU]/mL (ref 0.450–4.500)

## 2023-11-02 ENCOUNTER — Encounter: Payer: Self-pay | Admitting: Nurse Practitioner

## 2023-11-02 ENCOUNTER — Ambulatory Visit (INDEPENDENT_AMBULATORY_CARE_PROVIDER_SITE_OTHER): Admitting: Nurse Practitioner

## 2023-11-02 VITALS — BP 109/75 | HR 80 | Temp 98.5°F | Ht 69.0 in | Wt 205.0 lb

## 2023-11-02 DIAGNOSIS — F418 Other specified anxiety disorders: Secondary | ICD-10-CM | POA: Diagnosis not present

## 2023-11-02 DIAGNOSIS — L409 Psoriasis, unspecified: Secondary | ICD-10-CM | POA: Diagnosis not present

## 2023-11-02 DIAGNOSIS — I10 Essential (primary) hypertension: Secondary | ICD-10-CM

## 2023-11-02 DIAGNOSIS — E039 Hypothyroidism, unspecified: Secondary | ICD-10-CM | POA: Diagnosis not present

## 2023-11-02 MED ORDER — CLOBETASOL PROPIONATE 0.05 % EX SOLN
1.0000 | Freq: Two times a day (BID) | CUTANEOUS | 0 refills | Status: DC
Start: 2023-11-02 — End: 2024-03-07

## 2023-11-02 MED ORDER — TRIAMTERENE-HCTZ 75-50 MG PO TABS
1.0000 | ORAL_TABLET | Freq: Every day | ORAL | 1 refills | Status: DC
Start: 2023-11-02 — End: 2024-05-04

## 2023-11-02 MED ORDER — BUPROPION HCL ER (XL) 300 MG PO TB24: ORAL_TABLET | ORAL | 1 refills | Status: DC

## 2023-11-02 MED ORDER — LEVOTHYROXINE SODIUM 88 MCG PO TABS: 88.0000 ug | ORAL_TABLET | Freq: Every day | ORAL | 1 refills | Status: DC

## 2023-11-02 MED ORDER — LISINOPRIL 5 MG PO TABS: ORAL_TABLET | ORAL | 1 refills | Status: DC

## 2023-11-02 NOTE — Progress Notes (Signed)
 Subjective:    Patient ID: Eileen Nunez, female    DOB: March 20, 1981, 43 y.o.   MRN: [id number]  HPI Presents for routine follow-up.  Doing well on current regimen.  Also here to discuss her recent lab work.  Using occasional clobetasol  topical on a few areas of psoriasis specifically at the back of the scalp near the ear and on the extensor surfaces of the elbows.  No changes in the amount of skin involvement.  Overall healthy diet.  Staying active.  On semaglutide from another provider, states she does not take it every week but every 2 to 3 weeks.  Review of Systems  HENT:  Negative for sore throat and trouble swallowing.   Respiratory:  Negative for cough, chest tightness, shortness of breath and wheezing.   Cardiovascular:  Negative for chest pain and leg swelling.  Psychiatric/Behavioral:  Negative for suicidal ideas.       11/02/2023    9:12 AM  Depression screen PHQ 2/9  Decreased Interest 0  Down, Depressed, Hopeless 0  PHQ - 2 Score 0  Altered sleeping 0  Tired, decreased energy 1  Change in appetite 0  Feeling bad or failure about yourself  0  Trouble concentrating 0  Moving slowly or fidgety/restless 0  Suicidal thoughts 0  PHQ-9 Score 1  Difficult doing work/chores Not difficult at all      11/02/2023    9:12 AM 10/31/2022   10:26 AM 08/10/2020    9:38 AM  GAD 7 : Generalized Anxiety Score  Nervous, Anxious, on Edge 1 0 0  Control/stop worrying 1 1 0  Worry too much - different things 0 0 0  Trouble relaxing 1 0 0  Restless 0 0 0  Easily annoyed or irritable 0 0 1  Afraid - awful might happen 0 0 0  Total GAD 7 Score 3 1 1   Anxiety Difficulty Not difficult at all      Social History   Tobacco Use   Smoking status: Former    Current packs/day: 0.00    Average packs/day: 0.5 packs/day for 21.4 years (10.7 ttl pk-yrs)    Types: Cigarettes    Start date: 09/21/1994    Quit date: 02/22/2016    Years since quitting: 7.6   Smokeless tobacco: Never  Vaping  Use   Vaping status: Never Used  Substance Use Topics   Alcohol use: Yes    Alcohol/week: 2.0 - 3.0 standard drinks of alcohol    Types: 2 - 3 Standard drinks or equivalent per week   Drug use: No        Objective:   Physical Exam Vitals and nursing note reviewed.  Constitutional:      General: She is not in acute distress. Neck:     Comments: Thyroid nontender to palpation, no mass or goiter noted. Cardiovascular:     Rate and Rhythm: Normal rate and regular rhythm.     Heart sounds: Normal heart sounds.  Pulmonary:     Effort: Pulmonary effort is normal.     Breath sounds: Normal breath sounds.  Musculoskeletal:     Cervical back: Normal range of motion and neck supple.     Right lower leg: No edema.     Left lower leg: No edema.  Lymphadenopathy:     Cervical: No cervical adenopathy.  Neurological:     Mental Status: She is alert.  Psychiatric:        Mood and Affect: Mood normal.  Behavior: Behavior normal.        Thought Content: Thought content normal.        Judgment: Judgment normal.    Today's Vitals   11/02/23 0910  BP: 109/75  Pulse: 80  Temp: 98.5 F (36.9 C)  SpO2: 98%  Weight: 205 lb (93 kg)  Height: 5\' 9"  (1.753 m)   Body mass index is 30.27 kg/m.    Results for orders placed or performed in visit on 09/29/23  CBC with Differential/Platelet   Collection Time: 10/30/23  9:40 AM  Result Value Ref Range   WBC 9.2 3.4 - 10.8 x10E3/uL   RBC 4.82 3.77 - 5.28 x10E6/uL   Hemoglobin 15.8 11.1 - 15.9 g/dL   Hematocrit 57.8 46.9 - 46.6 %   MCV 95 79 - 97 fL   MCH 32.8 26.6 - 33.0 pg   MCHC 34.3 31.5 - 35.7 g/dL   RDW 62.9 52.8 - 41.3 %   Platelets 221 150 - 450 x10E3/uL   Neutrophils 72 Not Estab. %   Lymphs 19 Not Estab. %   Monocytes 6 Not Estab. %   Eos 2 Not Estab. %   Basos 1 Not Estab. %   Neutrophils Absolute 6.6 1.4 - 7.0 x10E3/uL   Lymphocytes Absolute 1.7 0.7 - 3.1 x10E3/uL   Monocytes Absolute 0.6 0.1 - 0.9 x10E3/uL   EOS  (ABSOLUTE) 0.2 0.0 - 0.4 x10E3/uL   Basophils Absolute 0.1 0.0 - 0.2 x10E3/uL   Immature Granulocytes 0 Not Estab. %   Immature Grans (Abs) 0.0 0.0 - 0.1 x10E3/uL  Comprehensive metabolic panel with GFR   Collection Time: 10/30/23  9:40 AM  Result Value Ref Range   Glucose 88 70 - 99 mg/dL   BUN 14 6 - 24 mg/dL   Creatinine, Ser 2.44 (H) 0.57 - 1.00 mg/dL   eGFR 60 >01 UU/VOZ/3.66   BUN/Creatinine Ratio 12 9 - 23   Sodium 137 134 - 144 mmol/L   Potassium 4.1 3.5 - 5.2 mmol/L   Chloride 98 96 - 106 mmol/L   CO2 24 20 - 29 mmol/L   Calcium 9.7 8.7 - 10.2 mg/dL   Total Protein 7.5 6.0 - 8.5 g/dL   Albumin 4.7 3.9 - 4.9 g/dL   Globulin, Total 2.8 1.5 - 4.5 g/dL   Bilirubin Total 1.4 (H) 0.0 - 1.2 mg/dL   Alkaline Phosphatase 75 44 - 121 IU/L   AST 24 0 - 40 IU/L   ALT 31 0 - 32 IU/L  TSH   Collection Time: 10/30/23  9:40 AM  Result Value Ref Range   TSH 2.490 0.450 - 4.500 uIU/mL  T4, free   Collection Time: 10/30/23  9:40 AM  Result Value Ref Range   Free T4 1.06 0.82 - 1.77 ng/dL   Reviewed labs with patient during visit.     Assessment & Plan:   Problem List Items Addressed This Visit       Cardiovascular and Mediastinum   Hypertension - Primary   Relevant Medications   lisinopril  (ZESTRIL ) 5 MG tablet   triamterene -hydrochlorothiazide (MAXZIDE) 75-50 MG tablet     Endocrine   Hypothyroidism   Relevant Medications   levothyroxine  (SYNTHROID ) 88 MCG tablet     Musculoskeletal and Integument   Psoriasis   Relevant Medications   clobetasol  (TEMOVATE ) 0.05 % external solution     Other   Depression with anxiety   Relevant Medications   buPROPion  (WELLBUTRIN  XL) 300 MG 24 hr tablet  Meds ordered this encounter  Medications   buPROPion  (WELLBUTRIN  XL) 300 MG 24 hr tablet    Sig: TAKE 1 TABLET(300 MG) BY MOUTH DAILY    Dispense:  90 tablet    Refill:  1    Supervising Provider:   Charlotta Cook A [[id number]]   clobetasol  (TEMOVATE ) 0.05 % external solution     Sig: Apply 1 Application topically 2 (two) times daily. prn    Dispense:  50 mL    Refill:  0    Supervising Provider:   Charlotta Cook A [[id number]]   levothyroxine  (SYNTHROID ) 88 MCG tablet    Sig: Take 1 tablet (88 mcg total) by mouth daily.    Dispense:  90 tablet    Refill:  1    Supervising Provider:   Charlotta Cook A [[id number]]   lisinopril  (ZESTRIL ) 5 MG tablet    Sig: TAKE 1 TABLET(5 MG) BY MOUTH DAILY    Dispense:  90 tablet    Refill:  1    Supervising Provider:   Charlotta Cook A [[id number]]   triamterene -hydrochlorothiazide (MAXZIDE) 75-50 MG tablet    Sig: Take 1 tablet by mouth daily.    Dispense:  90 tablet    Refill:  1    Supervising Provider:   Bennet Brasil [phone]   Lab work is stable at this time.  Continue current medication regimen as directed.  Continue follow-up with gynecology for physicals and mammograms. Return in about 6 months (around 05/04/2024).

## 2024-03-07 ENCOUNTER — Telehealth: Payer: Self-pay | Admitting: Family Medicine

## 2024-03-07 ENCOUNTER — Other Ambulatory Visit: Payer: Self-pay | Admitting: Nurse Practitioner

## 2024-03-07 DIAGNOSIS — L409 Psoriasis, unspecified: Secondary | ICD-10-CM

## 2024-03-07 MED ORDER — CLOBETASOL PROPIONATE 0.05 % EX SOLN: 1.0000 | Freq: Two times a day (BID) | CUTANEOUS | 0 refills | Status: AC

## 2024-03-07 NOTE — Telephone Encounter (Signed)
 Refill on Clobetasol  Prop 0.05  [facility]-Fax -[phone]

## 2024-05-04 ENCOUNTER — Other Ambulatory Visit: Payer: Self-pay | Admitting: Nurse Practitioner

## 2024-05-14 ENCOUNTER — Other Ambulatory Visit: Payer: Self-pay | Admitting: Nurse Practitioner

## 2024-07-18 ENCOUNTER — Other Ambulatory Visit: Payer: Self-pay | Admitting: Nurse Practitioner

## 2024-07-18 DIAGNOSIS — I10 Essential (primary) hypertension: Secondary | ICD-10-CM
